# Patient Record
Sex: Male | Born: 1950
Health system: Southern US, Community
[De-identification: ages and names within clinical notes are randomized; demographics above are authoritative.]

## PROBLEM LIST (undated history)

## (undated) DIAGNOSIS — M2550 Pain in unspecified joint: Secondary | ICD-10-CM

## (undated) DIAGNOSIS — R002 Palpitations: Secondary | ICD-10-CM

## (undated) DIAGNOSIS — R112 Nausea with vomiting, unspecified: Secondary | ICD-10-CM

## (undated) DIAGNOSIS — R011 Cardiac murmur, unspecified: Secondary | ICD-10-CM

## (undated) DIAGNOSIS — F419 Anxiety disorder, unspecified: Secondary | ICD-10-CM

## (undated) DIAGNOSIS — N529 Male erectile dysfunction, unspecified: Secondary | ICD-10-CM

## (undated) DIAGNOSIS — R55 Syncope and collapse: Secondary | ICD-10-CM

## (undated) DIAGNOSIS — G4730 Sleep apnea, unspecified: Secondary | ICD-10-CM

## (undated) DIAGNOSIS — R269 Unspecified abnormalities of gait and mobility: Secondary | ICD-10-CM

## (undated) DIAGNOSIS — E1142 Type 2 diabetes mellitus with diabetic polyneuropathy: Secondary | ICD-10-CM

## (undated) DIAGNOSIS — I2510 Atherosclerotic heart disease of native coronary artery without angina pectoris: Secondary | ICD-10-CM

## (undated) DIAGNOSIS — I10 Essential (primary) hypertension: Secondary | ICD-10-CM

## (undated) DIAGNOSIS — E7800 Pure hypercholesterolemia, unspecified: Secondary | ICD-10-CM

## (undated) DIAGNOSIS — F319 Bipolar disorder, unspecified: Secondary | ICD-10-CM

## (undated) DIAGNOSIS — Z87442 Personal history of urinary calculi: Secondary | ICD-10-CM

## (undated) DIAGNOSIS — M1990 Unspecified osteoarthritis, unspecified site: Secondary | ICD-10-CM

## (undated) DIAGNOSIS — R2689 Other abnormalities of gait and mobility: Secondary | ICD-10-CM

## (undated) DIAGNOSIS — E039 Hypothyroidism, unspecified: Secondary | ICD-10-CM

## (undated) DIAGNOSIS — F329 Major depressive disorder, single episode, unspecified: Secondary | ICD-10-CM

## (undated) DIAGNOSIS — Z9889 Other specified postprocedural states: Secondary | ICD-10-CM

## (undated) DIAGNOSIS — R519 Headache, unspecified: Secondary | ICD-10-CM

## (undated) DIAGNOSIS — E119 Type 2 diabetes mellitus without complications: Secondary | ICD-10-CM

## (undated) DIAGNOSIS — F32A Depression, unspecified: Secondary | ICD-10-CM

## (undated) HISTORY — DX: Male erectile dysfunction, unspecified: N52.9

## (undated) HISTORY — DX: Anxiety disorder, unspecified: F41.9

## (undated) HISTORY — DX: Depression, unspecified: F32.A

## (undated) HISTORY — DX: Essential (primary) hypertension: I10

## (undated) HISTORY — PX: APPENDECTOMY: SHX54

## (undated) HISTORY — DX: Palpitations: R00.2

## (undated) HISTORY — DX: Unspecified abnormalities of gait and mobility: R26.9

## (undated) HISTORY — DX: Unspecified osteoarthritis, unspecified site: M19.90

## (undated) HISTORY — PX: VASECTOMY REVERSAL: SHX243

## (undated) HISTORY — DX: Pure hypercholesterolemia, unspecified: E78.00

## (undated) HISTORY — DX: Major depressive disorder, single episode, unspecified: F32.9

## (undated) HISTORY — PX: EYE SURGERY: SHX253

## (undated) HISTORY — DX: Pain in unspecified joint: M25.50

## (undated) HISTORY — DX: Other abnormalities of gait and mobility: R26.89

## (undated) HISTORY — DX: Syncope and collapse: R55

## (undated) HISTORY — DX: Type 2 diabetes mellitus with diabetic polyneuropathy: E11.42

## (undated) HISTORY — DX: Hypothyroidism, unspecified: E03.9

## (undated) HISTORY — DX: Bipolar disorder, unspecified: F31.9

## (undated) HISTORY — DX: Type 2 diabetes mellitus without complications: E11.9

## (undated) HISTORY — PX: CHOLECYSTECTOMY: SHX55

---
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
## 1972-02-13 HISTORY — PX: NASAL SEPTUM SURGERY: SHX37

## 1978-02-12 DIAGNOSIS — E119 Type 2 diabetes mellitus without complications: Secondary | ICD-10-CM

## 1978-02-12 HISTORY — DX: Type 2 diabetes mellitus without complications: E11.9

## 1986-02-12 HISTORY — PX: VASECTOMY: SHX75

## 1993-02-12 DIAGNOSIS — I10 Essential (primary) hypertension: Secondary | ICD-10-CM

## 1993-02-12 HISTORY — DX: Essential (primary) hypertension: I10

## 2000-02-13 HISTORY — PX: COLONOSCOPY W/ POLYPECTOMY: SHX1380

## 2000-04-30 ENCOUNTER — Other Ambulatory Visit: Admission: RE | Admit: 2000-04-30 | Discharge: 2000-04-30 | Payer: Self-pay | Admitting: Gastroenterology

## 2000-04-30 ENCOUNTER — Encounter (INDEPENDENT_AMBULATORY_CARE_PROVIDER_SITE_OTHER): Payer: Self-pay

## 2001-10-08 ENCOUNTER — Ambulatory Visit (HOSPITAL_COMMUNITY): Admission: RE | Admit: 2001-10-08 | Discharge: 2001-10-08 | Payer: Self-pay | Admitting: Family Medicine

## 2002-05-21 ENCOUNTER — Encounter: Payer: Self-pay | Admitting: Family Medicine

## 2002-05-21 ENCOUNTER — Ambulatory Visit (HOSPITAL_COMMUNITY): Admission: RE | Admit: 2002-05-21 | Discharge: 2002-05-21 | Payer: Self-pay | Admitting: Family Medicine

## 2002-05-22 ENCOUNTER — Inpatient Hospital Stay (HOSPITAL_COMMUNITY): Admission: EM | Admit: 2002-05-22 | Discharge: 2002-05-24 | Payer: Self-pay | Admitting: Emergency Medicine

## 2002-05-22 ENCOUNTER — Encounter: Payer: Self-pay | Admitting: Family Medicine

## 2003-10-14 ENCOUNTER — Ambulatory Visit: Payer: Self-pay | Admitting: Psychiatry

## 2003-11-25 ENCOUNTER — Ambulatory Visit: Payer: Self-pay | Admitting: Psychiatry

## 2004-01-10 ENCOUNTER — Ambulatory Visit: Payer: Self-pay | Admitting: Psychiatry

## 2004-01-24 ENCOUNTER — Ambulatory Visit: Payer: Self-pay | Admitting: Psychiatry

## 2004-03-07 ENCOUNTER — Ambulatory Visit: Payer: Self-pay | Admitting: Psychiatry

## 2004-03-07 ENCOUNTER — Other Ambulatory Visit (HOSPITAL_COMMUNITY): Admission: RE | Admit: 2004-03-07 | Discharge: 2004-06-05 | Payer: Self-pay | Admitting: Psychiatry

## 2004-04-21 ENCOUNTER — Ambulatory Visit: Payer: Self-pay | Admitting: Psychiatry

## 2004-06-01 ENCOUNTER — Ambulatory Visit: Payer: Self-pay | Admitting: Psychiatry

## 2004-08-17 ENCOUNTER — Ambulatory Visit: Payer: Self-pay | Admitting: Psychiatry

## 2004-09-28 ENCOUNTER — Ambulatory Visit: Payer: Self-pay | Admitting: Psychiatry

## 2004-11-14 ENCOUNTER — Ambulatory Visit: Payer: Self-pay | Admitting: Psychiatry

## 2004-12-26 ENCOUNTER — Ambulatory Visit: Payer: Self-pay | Admitting: Psychiatry

## 2005-02-13 ENCOUNTER — Ambulatory Visit: Payer: Self-pay | Admitting: Psychiatry

## 2005-04-05 ENCOUNTER — Ambulatory Visit: Payer: Self-pay | Admitting: Psychiatry

## 2005-04-24 ENCOUNTER — Ambulatory Visit (HOSPITAL_COMMUNITY): Payer: Self-pay | Admitting: Psychiatry

## 2005-05-01 ENCOUNTER — Ambulatory Visit (HOSPITAL_COMMUNITY): Payer: Self-pay | Admitting: Psychiatry

## 2005-05-25 ENCOUNTER — Ambulatory Visit (HOSPITAL_COMMUNITY): Payer: Self-pay | Admitting: Psychiatry

## 2005-06-05 ENCOUNTER — Ambulatory Visit (HOSPITAL_COMMUNITY): Payer: Self-pay | Admitting: Psychiatry

## 2005-07-05 ENCOUNTER — Ambulatory Visit (HOSPITAL_COMMUNITY): Payer: Self-pay | Admitting: Psychiatry

## 2005-08-30 ENCOUNTER — Ambulatory Visit (HOSPITAL_COMMUNITY): Payer: Self-pay | Admitting: Psychiatry

## 2005-09-27 ENCOUNTER — Ambulatory Visit (HOSPITAL_COMMUNITY): Payer: Self-pay | Admitting: Psychiatry

## 2005-11-22 ENCOUNTER — Ambulatory Visit (HOSPITAL_COMMUNITY): Payer: Self-pay | Admitting: Psychiatry

## 2005-12-18 ENCOUNTER — Ambulatory Visit (HOSPITAL_COMMUNITY): Payer: Self-pay | Admitting: Psychiatry

## 2006-01-29 ENCOUNTER — Ambulatory Visit (HOSPITAL_COMMUNITY): Payer: Self-pay | Admitting: Psychiatry

## 2006-04-02 ENCOUNTER — Ambulatory Visit (HOSPITAL_COMMUNITY): Payer: Self-pay | Admitting: Psychiatry

## 2006-04-30 ENCOUNTER — Ambulatory Visit (HOSPITAL_COMMUNITY): Payer: Self-pay | Admitting: Psychiatry

## 2006-05-28 ENCOUNTER — Ambulatory Visit (HOSPITAL_COMMUNITY): Payer: Self-pay | Admitting: Psychiatry

## 2006-07-30 ENCOUNTER — Ambulatory Visit (HOSPITAL_COMMUNITY): Payer: Self-pay | Admitting: Psychiatry

## 2006-09-05 ENCOUNTER — Ambulatory Visit (HOSPITAL_COMMUNITY): Payer: Self-pay | Admitting: Psychiatry

## 2006-10-07 ENCOUNTER — Ambulatory Visit (HOSPITAL_COMMUNITY): Payer: Self-pay | Admitting: Psychology

## 2006-10-23 ENCOUNTER — Ambulatory Visit (HOSPITAL_COMMUNITY): Payer: Self-pay | Admitting: Psychology

## 2006-11-05 ENCOUNTER — Ambulatory Visit (HOSPITAL_COMMUNITY): Payer: Self-pay | Admitting: Psychiatry

## 2006-12-05 ENCOUNTER — Ambulatory Visit (HOSPITAL_COMMUNITY): Payer: Self-pay | Admitting: Psychiatry

## 2006-12-18 ENCOUNTER — Ambulatory Visit (HOSPITAL_COMMUNITY): Payer: Self-pay | Admitting: Psychology

## 2006-12-19 ENCOUNTER — Ambulatory Visit (HOSPITAL_COMMUNITY): Payer: Self-pay | Admitting: Psychiatry

## 2007-01-02 ENCOUNTER — Ambulatory Visit (HOSPITAL_COMMUNITY): Payer: Self-pay | Admitting: Psychology

## 2007-02-18 ENCOUNTER — Ambulatory Visit (HOSPITAL_COMMUNITY): Payer: Self-pay | Admitting: Psychiatry

## 2007-05-20 ENCOUNTER — Ambulatory Visit (HOSPITAL_COMMUNITY): Payer: Self-pay | Admitting: Psychiatry

## 2007-06-24 ENCOUNTER — Ambulatory Visit (HOSPITAL_COMMUNITY): Payer: Self-pay | Admitting: Psychiatry

## 2007-07-17 ENCOUNTER — Ambulatory Visit: Payer: Self-pay | Admitting: Cardiovascular Disease

## 2007-07-17 ENCOUNTER — Encounter: Payer: Self-pay | Admitting: Cardiovascular Disease

## 2007-07-17 ENCOUNTER — Ambulatory Visit (HOSPITAL_COMMUNITY): Admission: RE | Admit: 2007-07-17 | Discharge: 2007-07-17 | Payer: Self-pay | Admitting: Cardiovascular Disease

## 2007-08-07 ENCOUNTER — Ambulatory Visit: Payer: Self-pay | Admitting: Cardiovascular Disease

## 2007-08-18 ENCOUNTER — Ambulatory Visit: Payer: Self-pay | Admitting: Cardiovascular Disease

## 2007-11-06 ENCOUNTER — Ambulatory Visit (HOSPITAL_COMMUNITY): Payer: Self-pay | Admitting: Psychiatry

## 2007-11-27 ENCOUNTER — Ambulatory Visit (HOSPITAL_COMMUNITY): Payer: Self-pay | Admitting: Psychiatry

## 2008-04-01 ENCOUNTER — Ambulatory Visit (HOSPITAL_COMMUNITY): Payer: Self-pay | Admitting: Psychiatry

## 2008-05-04 ENCOUNTER — Ambulatory Visit (HOSPITAL_COMMUNITY): Payer: Self-pay | Admitting: Psychiatry

## 2008-06-09 ENCOUNTER — Emergency Department (HOSPITAL_COMMUNITY): Admission: EM | Admit: 2008-06-09 | Discharge: 2008-06-09 | Payer: Self-pay | Admitting: Emergency Medicine

## 2008-07-01 ENCOUNTER — Ambulatory Visit (HOSPITAL_COMMUNITY): Payer: Self-pay | Admitting: Psychiatry

## 2008-08-03 ENCOUNTER — Ambulatory Visit (HOSPITAL_COMMUNITY): Payer: Self-pay | Admitting: Psychiatry

## 2008-09-28 ENCOUNTER — Ambulatory Visit (HOSPITAL_COMMUNITY): Payer: Self-pay | Admitting: Psychiatry

## 2009-01-04 ENCOUNTER — Ambulatory Visit (HOSPITAL_COMMUNITY): Payer: Self-pay | Admitting: Psychiatry

## 2010-01-30 ENCOUNTER — Ambulatory Visit (HOSPITAL_COMMUNITY)
Admission: RE | Admit: 2010-01-30 | Discharge: 2010-01-30 | Payer: Self-pay | Source: Home / Self Care | Attending: Ophthalmology | Admitting: Ophthalmology

## 2010-04-24 LAB — BASIC METABOLIC PANEL
GFR calc non Af Amer: >60 mL/min (ref >60)
Potassium: 4.2 mEq/L (ref 3.5–5.1)
Sodium: 138 mEq/L (ref 135–145)

## 2010-04-24 LAB — HEMOGLOBIN AND HEMATOCRIT, BLOOD
HCT: 41.8 % (ref 39.0–52.0)
Hemoglobin: 14.7 g/dL (ref 13.0–17.0)

## 2010-04-24 LAB — GLUCOSE, CAPILLARY: Glucose-Capillary: 190 mg/dL — ABNORMAL HIGH (ref 70–99)

## 2010-05-04 ENCOUNTER — Encounter: Payer: Self-pay | Admitting: Gastroenterology

## 2010-05-11 NOTE — Letter (Signed)
Summary: Colonoscopy Letter  [facility]  [address] [place], [place] [address]   Phone: [phone]  Fax: [phone]      May 04, 2010 MRN: [id number]   Joshua Perez [address] [place], [place]  [address]   Dear Mr. IANNELLO,   According to your medical record, it is time for you to schedule a Colonoscopy. The American Cancer Society recommends this procedure as a method to detect early colon cancer. Patients with a family history of colon cancer, or a personal history of colon polyps or inflammatory bowel disease are at increased risk.  This letter has been generated based on the recommendations made at the time of your procedure. If you feel that in your particular situation this may no longer apply, please contact our office.  Please call our office at [phone] to schedule this appointment or to update your records at your earliest convenience.  Thank you for cooperating with [place] to provide you with the very best care possible.   Sincerely,  Judie Petit T. Russella Dar, M.D.  [facility] [phone]

## 2010-05-24 LAB — BASIC METABOLIC PANEL
BUN: 13 mg/dL (ref 6–23)
Calcium: 9.7 mg/dL (ref 8.4–10.5)
GFR calc non Af Amer: >60 mL/min (ref >60)
Potassium: 3.7 mEq/L (ref 3.5–5.1)

## 2010-05-24 LAB — POCT CARDIAC MARKERS
CKMB, poc: <1 ng/mL — ABNORMAL LOW (ref 1.0–8.0)
Troponin i, poc: <0.05 ng/mL (ref 0.00–0.09)
Troponin i, poc: <0.05 ng/mL (ref 0.00–0.09)

## 2010-06-18 ENCOUNTER — Inpatient Hospital Stay (HOSPITAL_COMMUNITY)
Admission: EM | Admit: 2010-06-18 | Discharge: 2010-06-20 | DRG: 391 | Disposition: A | Discharge status: Home / Self Care | Payer: Medicare Other | Attending: Internal Medicine | Admitting: Internal Medicine

## 2010-06-18 ENCOUNTER — Emergency Department (HOSPITAL_COMMUNITY): Payer: Medicare Other

## 2010-06-18 DIAGNOSIS — A088 Other specified intestinal infections: Principal | ICD-10-CM | POA: Diagnosis present

## 2010-06-18 DIAGNOSIS — I10 Essential (primary) hypertension: Secondary | ICD-10-CM | POA: Diagnosis present

## 2010-06-18 DIAGNOSIS — E860 Dehydration: Secondary | ICD-10-CM | POA: Diagnosis present

## 2010-06-18 DIAGNOSIS — R578 Other shock: Secondary | ICD-10-CM | POA: Diagnosis present

## 2010-06-18 DIAGNOSIS — E119 Type 2 diabetes mellitus without complications: Secondary | ICD-10-CM | POA: Diagnosis present

## 2010-06-18 DIAGNOSIS — F319 Bipolar disorder, unspecified: Secondary | ICD-10-CM | POA: Diagnosis present

## 2010-06-18 LAB — POCT CARDIAC MARKERS
CKMB, poc: <1 ng/mL — ABNORMAL LOW (ref 1.0–8.0)
Myoglobin, poc: 91.9 ng/mL (ref 12–200)

## 2010-06-18 LAB — BASIC METABOLIC PANEL
BUN: 28 mg/dL — ABNORMAL HIGH (ref 6–23)
BUN: 24 mg/dL — ABNORMAL HIGH (ref 6–23)
CO2: 19 mEq/L (ref 19–32)
Calcium: 8.3 mg/dL — ABNORMAL LOW (ref 8.4–10.5)
Calcium: 7.3 mg/dL — ABNORMAL LOW (ref 8.4–10.5)
Creatinine, Ser: 1.86 mg/dL — ABNORMAL HIGH (ref 0.4–1.5)
Creatinine, Ser: 1.3 mg/dL (ref 0.4–1.5)
GFR calc Af Amer: >60 mL/min (ref >60)
GFR calc non Af Amer: 37 mL/min — ABNORMAL LOW (ref >60)
GFR calc non Af Amer: 56 mL/min — ABNORMAL LOW (ref >60)
Glucose, Bld: 173 mg/dL — ABNORMAL HIGH (ref 70–99)
Sodium: 134 mEq/L — ABNORMAL LOW (ref 135–145)

## 2010-06-18 LAB — URINALYSIS, ROUTINE W REFLEX MICROSCOPIC
Glucose, UA: NEGATIVE mg/dL
Nitrite: NEGATIVE
Specific Gravity, Urine: 1.025 (ref 1.005–1.030)
pH: 5.5 (ref 5.0–8.0)

## 2010-06-18 LAB — DIFFERENTIAL
Basophils Absolute: 0.1 10*3/uL (ref 0.0–0.1)
Basophils Relative: 0 % (ref 0–1)
Eosinophils Absolute: 0.3 10*3/uL (ref 0.0–0.7)
Monocytes Relative: 15 % — ABNORMAL HIGH (ref 3–12)
Neutro Abs: 11.5 10*3/uL — ABNORMAL HIGH (ref 1.7–7.7)
Neutrophils Relative %: 71 % (ref 43–77)

## 2010-06-18 LAB — LITHIUM LEVEL: Lithium Lvl: 1.49 mEq/L — ABNORMAL HIGH (ref 0.80–1.40)

## 2010-06-18 LAB — MAGNESIUM: Magnesium: 1.6 mg/dL (ref 1.5–2.5)

## 2010-06-18 LAB — HEPATIC FUNCTION PANEL
Bilirubin, Direct: 0.2 mg/dL (ref 0.0–0.3)
Indirect Bilirubin: 0.4 mg/dL (ref 0.3–0.9)
Total Bilirubin: 0.6 mg/dL (ref 0.3–1.2)

## 2010-06-18 LAB — CBC
Hemoglobin: 15.5 g/dL (ref 13.0–17.0)
MCH: 31.9 pg (ref 26.0–34.0)
MCHC: 33.3 g/dL (ref 30.0–36.0)
Platelets: 367 10*3/uL (ref 150–400)
RBC: 4.86 MIL/uL (ref 4.22–5.81)

## 2010-06-18 LAB — GLUCOSE, CAPILLARY: Glucose-Capillary: 180 mg/dL — ABNORMAL HIGH (ref 70–99)

## 2010-06-18 LAB — MRSA PCR SCREENING

## 2010-06-18 LAB — PROCALCITONIN: Procalcitonin: 0.25 ng/mL

## 2010-06-18 MED ORDER — IOHEXOL 300 MG/ML  SOLN
80.0000 mL | Freq: Once | INTRAMUSCULAR | Status: AC | PRN
  Administered 2010-06-18: 80 mL via INTRAVENOUS

## 2010-06-19 LAB — LIPASE, BLOOD: Lipase: 83 U/L — ABNORMAL HIGH (ref 11–59)

## 2010-06-19 LAB — GLUCOSE, CAPILLARY
Glucose-Capillary: 135 mg/dL — ABNORMAL HIGH (ref 70–99)
Glucose-Capillary: 184 mg/dL — ABNORMAL HIGH (ref 70–99)

## 2010-06-19 LAB — CBC
Hemoglobin: 12.9 g/dL — ABNORMAL LOW (ref 13.0–17.0)
MCV: 97.3 fL (ref 78.0–100.0)
Platelets: 270 10*3/uL (ref 150–400)
RBC: 4.09 MIL/uL — ABNORMAL LOW (ref 4.22–5.81)
WBC: 14.3 10*3/uL — ABNORMAL HIGH (ref 4.0–10.5)

## 2010-06-19 LAB — COMPREHENSIVE METABOLIC PANEL
Albumin: 3 g/dL — ABNORMAL LOW (ref 3.5–5.2)
Alkaline Phosphatase: 64 U/L (ref 39–117)
BUN: 16 mg/dL (ref 6–23)
CO2: 23 mEq/L (ref 19–32)
Chloride: 108 mEq/L (ref 96–112)
GFR calc non Af Amer: >60 mL/min (ref >60)
Glucose, Bld: 149 mg/dL — ABNORMAL HIGH (ref 70–99)
Potassium: 4 mEq/L (ref 3.5–5.1)
Total Bilirubin: 0.3 mg/dL (ref 0.3–1.2)

## 2010-06-19 LAB — URINE CULTURE
Colony Count: NO GROWTH
Culture: NO GROWTH

## 2010-06-19 LAB — DIFFERENTIAL
Basophils Relative: 1 % (ref 0–1)
Eosinophils Absolute: 0.5 10*3/uL (ref 0.0–0.7)
Lymphs Abs: 2.9 10*3/uL (ref 0.7–4.0)
Neutro Abs: 9 10*3/uL — ABNORMAL HIGH (ref 1.7–7.7)
Neutrophils Relative %: 63 % (ref 43–77)

## 2010-06-19 NOTE — Group Therapy Note (Signed)
  NAME:  Joshua Perez, Joshua Perez              ACCOUNT NO.:  [id number]  MEDICAL RECORD NO.:  [id number]           PATIENT TYPE:  I  LOCATION:  IC01                          FACILITY:  [facility]  PHYSICIAN:  Wilson Singer, M.D.DATE OF BIRTH:  1951-01-08  DATE OF PROCEDURE:  06/19/2010 DATE OF DISCHARGE:                                PROGRESS NOTE   HISTORY OF PRESENT ILLNESS:  This man was admitted yesterday with profuse diarrhea and hypovolemic shock.  He has required several liters of fluid and he has had a total of 5 liters of bolus fluid and ongoing fluid at 150 mL an hour.  Now his blood pressure is better, and he feels better overall.  He says he has had no further diarrhea this morning.  PHYSICAL EXAMINATION:  VITAL SIGNS:  Temperature 97.8, blood pressure 100/55, pulse 64, saturation 97% on room air. GENERAL:  He looks well.  He does not look septic. CARDIOVASCULAR:  Heart sounds are present, normal. LUNG:  Fields are clear. ABDOMEN:  Soft and nontender.  Bowel sounds are rather scanty at the present time.  INVESTIGATIONS:  Hemoglobin 12.9, white blood cell count 14.3, platelets 270.  Sodium 138, potassium 4.0, bicarbonate 23, BUN 16, creatinine 0.94.  Lithium level elevated slightly at 1.49.  IMPRESSION: 1. Gastroenteritis, probably viral in origin. 2. Diabetes. 3. History of hypertension. 4. Bipolar disorder.  PLAN: 1. Reduce intravenous fluids. 2. Move to the regular floor now. 3. Start on a BRAT diet and progress to a diabetic diet if he     tolerates this. 4. Hopefully, we will be able to discharge him probably tomorrow.    Wilson Singer, M.D.    NCG/MEDQ  D:  06/19/2010  T:  06/19/2010  Job:  [id number]  Electronically Signed by Lilly Cove M.D. on 06/19/2010 10:52:45 AM

## 2010-06-19 NOTE — H&P (Signed)
NAME:  Joshua, Perez              ACCOUNT NO.:  [id number]  MEDICAL RECORD NO.:  [id number]           PATIENT TYPE:  I  LOCATION:  IC01                          FACILITY:  [facility]  PHYSICIAN:  Wilson Singer, M.D.DATE OF BIRTH:  06/08/1950  DATE OF ADMISSION:  06/18/2010 DATE OF DISCHARGE:  LH                             HISTORY & PHYSICAL   PRIMARY CARE PHYSICIAN:  Dr. Gerda Diss.  CHIEF COMPLAINT:  Vomiting and diarrhea.  HISTORY OF PRESENT ILLNESS:  This 60 year old man gives approximately 4- day history of vomiting and diarrhea.  The vomiting seems to subsided since he has come to the emergency room,  but the diarrhea continues.  He did not notice any blood in the diarrhea.  He has not been febrile. When he came to the emergency room, he was extremely hypotensive with a blood pressure of 72/42.  Since this time, he has had approximately 4 liters of fluid and his blood pressure seems to have improved to 106/53. He feels significantly better.  He has not been in contact with anyone who has had similar symptoms and he denies eating any food out of the ordinary.  PAST MEDICAL HISTORY: 1. Diabetes. 2. Hypertension. 3. Hypothyroidism. 4. Bipolar disorder.  MEDICATIONS:  He is on lithium, Actos, Effexor, moexipril, simvastatin, Xanax, multivitamins, vitamin C, trazodone, Flomax, levothyroxine, aspirin, vitamin D.  The doses of all these medications are known to him.  ALLERGIES:  PENICILLINS.  SOCIAL HISTORY:  He has been married for 40 years.  He does not smoke, does not drink alcohol.  He is on disability for his depression and bipolar disorder.  FAMILY HISTORY:  Noncontributory.  REVIEW OF SYSTEMS:  Apart from symptoms mentioned above, there are no other symptoms referable to all systems reviewed.  PHYSICAL EXAMINATION:  VITAL SIGNS:  Temperature 98.1, blood pressure currently 93/51, pulse 80, saturation 94% on room air, respiratory rate 12 to 14. GENERAL:  He does  look systemically, reasonably well and not in a shock state.  Does not look toxic.  There is no pallor.  There is no clubbing. There is no jaundice. CARDIOVASCULAR:  Heart sounds are present and normal without murmurs. Jugular venous pressure is not raised. RESPIRATORY:  Lung fields are clear. ABDOMEN:  Soft and nontender without any hepatosplenomegaly.  There are no masses felt.  Bowel sounds are present. NEUROLOGIC:  He is alert and oriented without any focal neurologic signs. SKIN:  There are no skin changes, rashes or lesions.  INVESTIGATIONS:  Hemoglobin 15.5, white blood cell count 16.0, platelets 367.  Sodium 134, potassium 3.1, glucose 173, bicarbonate 28, creatinine 1.86, which I think is probably increase from his baseline and in fact in December 2011, his creatinine was normal at 1.05.  Hepatic function panel is within normal limits.  Lipase is slightly elevated at 82 and I am not convinced clinically as he has pancreatitis.  Lactic acid however is also somewhat increased at 2.5.  PROBLEM LIST: 1. Acute gastroenteritis, probably viral in origin, but could be     bacterial also. 2. Hypovolemia shock. 3. Acute renal failure. 4. Diabetes. 5. History of hypertension. 6. History  of bipolar disorder.  PLAN: 1. Admit to step-down unit and monitor blood pressure closely. 2. Aggressive intravenous fluids. 3. Stool for culture and C diff toxin. 4. Control diabetes.  Further recommendations will depend on the patient's hospital progress.     Wilson Singer, M.D.     NCG/MEDQ  D:  06/18/2010  T:  06/18/2010  Job:  [id number]  cc:   Dr. Gerda Diss  Electronically Signed by Lilly Cove M.D. on 06/19/2010 10:52:36 AM

## 2010-06-20 LAB — DIFFERENTIAL
Basophils Absolute: 0.2 10*3/uL — ABNORMAL HIGH (ref 0.0–0.1)
Basophils Relative: 1 % (ref 0–1)
Eosinophils Relative: 4 % (ref 0–5)
Monocytes Absolute: 1.8 10*3/uL — ABNORMAL HIGH (ref 0.1–1.0)

## 2010-06-20 LAB — CBC
Hemoglobin: 14 g/dL (ref 13.0–17.0)
MCH: 31.3 pg (ref 26.0–34.0)
MCHC: 32.3 g/dL (ref 30.0–36.0)
Platelets: 317 10*3/uL (ref 150–400)

## 2010-06-20 LAB — BASIC METABOLIC PANEL
BUN: 6 mg/dL (ref 6–23)
Calcium: 8.7 mg/dL (ref 8.4–10.5)
Creatinine, Ser: 0.8 mg/dL (ref 0.4–1.5)
GFR calc non Af Amer: >60 mL/min (ref >60)
Glucose, Bld: 193 mg/dL — ABNORMAL HIGH (ref 70–99)

## 2010-06-20 LAB — OVA AND PARASITE EXAMINATION

## 2010-06-22 LAB — MRSA CULTURE

## 2010-06-23 LAB — STOOL CULTURE

## 2010-06-24 LAB — CULTURE, BLOOD (ROUTINE X 2)

## 2010-06-27 NOTE — Discharge Summary (Signed)
NAME:  Joshua Perez, Joshua Perez              ACCOUNT NO.:  [id number]  MEDICAL RECORD NO.:  [id number]           PATIENT TYPE:  I  LOCATION:  A335                          FACILITY:  [facility]  PHYSICIAN:  Wilson Singer, M.D.DATE OF BIRTH:  [birth year]  DATE OF ADMISSION:  06/18/2010 DATE OF DISCHARGE:  05/08/2012LH                              DISCHARGE SUMMARY   PRIMARY CARE PHYSICIAN:  Dr. Gerda Diss  FINAL DISCHARGE DIAGNOSIS: 1. Viral gastroenteritis. 2. Diabetes. 3. Hypertension. 4. Bipolar disorder.  CONDITION ON DISCHARGE:  Stable.  MEDICATIONS ON DISCHARGE:  The patient will continue all home medications which includes; 1. Actos 15 mg daily. 2. Alprazolam 2 mg t.i.d. 3. Aspirin 81 mg daily. 4. Effexor XR 150 mg b.i.d. 5. Finasteride 5 mg daily. 6. Flomax 0.4 mg 2 capsules daily. 7. Ketorolac ophthalmic eye drops 1 drop right eye 4 times a day. 8. Levothyroxine 100 mcg daily. 9. Lithium 300 mg 2-3 capsules b.i.d. 10.Moexipril 15 mg b.i.d. 11.Multivitamin 1/2 tablet twice daily. 12.Prednisolone acetate eye drops in the right eye 4 times a day. 13.Saphris 10 mg 2 tablets at bedtime. 14.Simvastatin 20 mg at bedtime. 15.Trazodone 100 mg 3 tablets at bedtime. 16.Valium 5 mg daily. 17.Vigamox 0.5 ophthalmic eye drops right eye 4 times a day. 18.Vitamin C 1 g daily. 19.Vitamin D3 400 mg b.i.d.  CONDITION ON DISCHARGE:  Stable.  HISTORY:  This pleasant 60 year old man was admitted with vomiting and diarrhea.  Please see initial history and physical examination done by [name].  HOSPITAL PROGRESS:  When the patient was admitted, he was clinically and biochemically dehydrated with creatinine of 1.86.  His baseline being 1.05 range.  He was given aggressive intravenous fluids.  As initially when he presented, he was in hypovolemic shock state with a blood pressure in the 70s.  He was not, however, toxic.  He was admitted in the step-down unit.  He did well with  aggressive intravenous fluid therapy and he started to improve and able to tolerate a diet.  Today, he feels back to his usual self.  He has had some bowel motions, which are not as diarrheal as they were before and overall he feels well and is able to tolerate diet.  PHYSICAL EXAMINATION:  VITAL SIGNS:  Temperature 98, blood pressure 147/66, pulse 74, saturation 97% on room air. HEART:  Sounds are present and normal. CHEST:  Lung fields are clear. ABDOMEN:  Soft and nontender and bowel sounds are present.  LABORATORY DATA:  Investigations today show hemoglobin 14.0, white blood cell count still little elevated at 17.3, platelets 317.  Sodium 139, potassium 3.6, bicarbonate 18, BUN 6, creatinine 0.8, glucose 193.  C diff toxin was negative.  Lithium level 2 days ago where elevate of 1.49.  I have not checked them today as he is clinically stable.  DISPOSITION:  The patient is stable to be discharged home.  I think, he will continue to improve at home.  I think, he needs to go and see his primary care physician to make sure his white blood cell count does normalize in the next week or 2.  He  will restart all his home medications including the lithium.     Wilson Singer, M.D.     NCG/MEDQ  D:  06/20/2010  T:  06/20/2010  Job:  [id number]  cc:   Dr. Gerda Diss  Electronically Signed by Lilly Cove M.D. on 06/27/2010 08:12:07 AM

## 2010-06-27 NOTE — Assessment & Plan Note (Signed)
[facility]                       [place] [facility] OFFICE NOTE   WHITMAN, MEINHARDT                     MRN:          [id number]  DATE:07/17/2007                            DOB:          [birth year]    REFERRING PHYSICIAN:  Donna Bernard, M.D.   Mr. Strebeck is referred by Dr. Lubertha South for further evaluation of  atypical chest pain and palpitations.   IDENTIFICATION:  The patient unfortunately has had lifelong severe  depression.  He has had ECT shock therapy on two occasions one from  [place] and once over at [facility].  He has been on a slew of  antidepressants.   In talking to his wife, up until about 6 months ago he was primarily in  the bed, and he has had extremely limited exercise activity due to his  severe depression and melancholy.  He complains of his heart beating  rapidly.  This does tend to occur with exertion.  The wife seems to  think that he is so deconditioned that his heart beats quickly with any  kind of activity.  I suspect she is correct.   The patient does not get palpitations when in the bed.  He has not had  presyncope.  He has had some atypical chest pain that again seem to be  related to anxiety.  They occur when he thinks too much and possibly at  times when he walks he still feels a bit unsteady on his feet due to his  medication and lack of activity.  I do not have recent lithium level on  him.   PAST MEDICAL HISTORY:  Otherwise fairly remarkable for:  1. Diabetes.  2. Severe depression.  3. Hypertension.   ALLERGIES:  PENICILLIN.   He had a previous right hernia repair, a previous deviated septum  repair, previous vasectomy.   FAMILY HISTORY:  Remarkable for father dying of CHF.  Mother is still  living.   He is on:  1. Oxytrol.  2. Vitamin D.  3. Aspirin.  4. Lithium 300 five times a day.  5. Benztropine 2 mg one-half tablet at bedtime.  6. Hydrochlorothiazide 12.5 a day.  7.  Diazepam 5 three times a day.  8. Risperidone 2 mg a day  9. Effexor 150 a day.  10.Caduet 10 a day.  11.Actos 45 a day.  12.Lexapro 15 a day.   The patient is married, his wife seems extremely supportive given what  she has been through.  She still wants her husband to get back to his  normal state.  She is very proactive with him.  They have 3 children and  6 grandchildren.  As indicated, the patient has been barely functional  and is just starting to get out of the bed and ambulate.   His exam is remarkable for an anxious and slow speaking white male who  seems very tenuous.  Weight is 288.  Blood pressure is 103/67.  Pulse 96  and regular.  Respiratory rate 16.  Afebrile.  He is tremulous in his  hands.  HEENT:  Unremarkable.  Carotids normal without  bruit.  No  lymphadenopathy, thyromegaly JVP elevation.  LUNGS:  Clear, diaphragmatic motion.  No wheezing, S1-S2 normal heart  sounds.  PMI normal.  ABDOMEN:  Bowel sounds positive.  No AAA, no tenderness.  No bruit.  No  hepatosplenomegaly or reflexes.  Pulses are intact.  No edema.  NEURO:  Nonfocal.  SKIN:  Warm and dry.  No muscular weakness.  The patient is able to  ambulate up and down the hall with a normal increase in heart rate to  about 110.  He lifts a little bit from side-to-side with his ambulation.   IMPRESSION:  1. Palpitations.  Likely benign and due to deconditioning.  Also      likely due to multiple centrally acting drugs that he takes for his      anxiety and depression.  We will give and event monitor for 4 weeks      and let him activate it for what he feels is palpitations.      Hopefully it will remain sinus tachycardia.  Would not add With not      had beta blockers at this time due to his history of melancholy and      depression.  2. Dyspnea likely functional again from deconditioning.  Check 2-D      echocardiogram, assess RV and LV function.  3. Hypertension currently well-controlled.  Continue  current dose of      Caduet and diuretic.  4. Diabetes.  I am not sure the patient's severity of diabetes.      However, I think he would probably be better served with Glucophage      rather than Actos in terms of his fluid retention, hemoglobin A1c      quarterly.  5. Anxiety and depression.  Follow up with psychiatrist and primary      care M.D.  He appears to have a long way ago.  He has had 2      previous shock treatments and apparently has been on these current      drugs for a while and is a little unclear to me as to whether he      will ever get over this.   However, it is encouraging the see that his wife is so supportive.  As  long as his event monitor does not show anything serious and his echo is  normal, we will see him back in 3 months.     Noralyn Pick. Eden Emms, MD, [facility]  Electronically Signed   PCN/MedQ  DD: 07/17/2007  DT: 07/17/2007  Job #: [id number]

## 2010-06-27 NOTE — Assessment & Plan Note (Signed)
[facility]                       [place] [facility] OFFICE NOTE   MYRON, STANKOVICH                     MRN:          [id number]  DATE:08/18/2007                            DOB:          [birth year]    Mr. Moffa returns today for followup.  He has had atypical chest pain,  palpitations, and hypertension.   His CardioNet monitor showed no significant arrhythmias.  He primarily  was in sinus rhythm with first-degree AV block.   His echocardiogram showed normal RV and LV function with no evidence of  valvular disease.  He has had less chest pain.  He continues to get  occasional palpitations, particularly after walking.  He seems to be out  and about little bit more.  He has severe depression and has been bed-  bound for quite sometime.  His wife continues to try to motivate him.  I  have reassured them about his monitor and echocardiogram.  He should  have no restrictions to exercise based on heart problem.   REVIEW OF SYSTEMS:  Otherwise remarkable for continued tremulousness  particularly in the right upper extremity, otherwise, negative.   MEDICATIONS:  1. Aspirin.  2. Lithium 300 mg 5 times a day.  3. Hydrochlorothiazide 12.5 a day.  4. Diazepam 5 t.i.d.  5. Effexor 150 b.i.d.  6. Caduet 10 a day.  7. Actos 45 a day.  8. Moexipril 15 mg a day.   ALLERGIES:  He is allergic to PENICILLIN.   PHYSICAL EXAMINATION:  GENERAL:  Remarkable for somewhat tremulous  overweight white male in no distress.  Affect is flat.  VITAL SIGNS:  Weight is 288, blood pressure is 103/67, pulse is 88 and  regular, respiratory rate 14, and afebrile.  HEENT:  Unremarkable.  NECK:  Carotids are normal without bruit.  No lymphadenopathy,  thyromegaly, or JVP elevation.  LUNGS:  Clear.  Good diaphragmatic motion.  No wheezing.  HEART:  S1 and S2 with normal heart sounds.  PMI normal.  ABDOMEN:  Benign.  Bowel sounds positive.  No AAA.  No bruit.  No  tenderness.  No hepatosplenomegaly.  No hepatojugular reflux.  EXTREMITIES:  Distal pulses are intact.  No edema.  NEURO:  Nonfocal.  SKIN:  Warm and dry.  No muscular weakness.   He does have a bit of a tremor in both upper extremities.  There is no  cogwheeling.   His echo was reviewed, EF 60%.  No significant valvular heart disease.   CardioNet monitor reviewed, sinus rhythm.  No significant arrhythmias or  first-degree heart block.   IMPRESSION:  1. Palpitations benign, likely occasional premature atrial      contractions, particularly postexercise.  No indication for beta-      blocker.  2. Hypertension.  Blood pressure currently under good control.      Continue Caduet.  3. Severe depression.  Follow up with his psychiatrist.  Continue      tricyclic and lithium.  We will need PT and OT to increase his      activity levels.  4. Lower extremity edema, improved.  Continue low-dose  hydrochlorothiazide.  I would not advocate higher dose diuretics      given his lithium therapy.  I would also be careful in regards to      his Actos as this may retain fluid.  5. Diabetes.  Follow up Dr. Gerda Diss.  Consider alternative oral agent      that will not retain fluid and require diuretic.   The patient's cardiac status would appear stable without any decrease in  LV function.  He will be seen on an as needed basis.     Noralyn Pick. Eden Emms, MD, [facility]  Electronically Signed    PCN/MedQ  DD: 08/18/2007  DT: 08/19/2007  Job #: [id number]   cc:   Donna Bernard, M.D.

## 2010-06-30 NOTE — Discharge Summary (Signed)
   NAME:  Joshua Perez, Joshua Perez NO.:  [id number]   MEDICAL RECORD NO.:  [id number]                  PATIENT TYPE:   LOCATION:                                       FACILITY:   PHYSICIAN:  Donna Bernard, M.D.             DATE OF BIRTH:   DATE OF ADMISSION:  DATE OF DISCHARGE:  05/24/2002                                 DISCHARGE SUMMARY   FINAL DIAGNOSES:  1. Acute pneumonia.  2. Vasculitis rash secondary to Augmentin.  3. Exacerbation of asthma.  4. Type 2 diabetes.  5. Hypertension.   FINAL DISPOSITION:  1. Patient discharged to home.  2. Follow up in the office in 1 week.   DISCHARGE MEDICATIONS:  1. Increase Actos to 45 mg daily while on prednisone.  2. Prednisone taper as directed.  3. Periactin as needed for itching, 4 mg,  3 times a day.  4. Benadryl 50 mg every 4 hours p.r.n.  5. Finish Levaquin.  6. Albuterol sprays every 4 hours for cough.  7. Xanax as needed.  8. Maintain other meds.   INITIAL HISTORY AND PHYSICAL:  Please see H&P as dictated.   HOSPITAL COURSE:  This patient is a 60 year old white male with a history of  hypertension, type 2 diabetes and asthma.  He presented to the emergency  room on day of admission with complaints of severe shortness of breath and  distress.  He had a severe rash, vasculitis in nature, felt to be due to a  prior dose of IV Augmentin.  He also had very significant wheezes and was  treated in the emergency room by nebulizer therapy and IV steroids.  In  addition, the patient's chest x-ray revealed a probable pneumonia.  Due to  his multiple difficulties also with significant dyspnea along with a blood  gas which revealed a low pCO2 at 22 and a low pO2 at 68 the patient  definitely warranted hospitalization.   We brought him into the hospital, we gave him IV Levaquin.  He given IV Solu-  Medrol, frequent nebulizers treatments, and over the next 48 hours the  patient gradually improved.  His  glucoses were maintained with sliding  scale.  On the day of discharge the patient was stable and discharged home  with the diagnosis and disposition as noted above.                                               Donna Bernard, M.D.    Karie Chimera  D:  07/10/2002  T:  07/10/2002  Job:  [id number]

## 2010-06-30 NOTE — H&P (Signed)
NAME:  Joshua Perez, DIEMER                        ACCOUNT NO.:  [id number]   MEDICAL RECORD NO.:  [id number]                   PATIENT TYPE:  INP   LOCATION:  A211                                 FACILITY:  [facility]   PHYSICIAN:  Donna Bernard, M.D.             DATE OF BIRTH:  01-20-1951   DATE OF ADMISSION:  05/22/2002  DATE OF DISCHARGE:                                HISTORY & PHYSICAL   CHIEF COMPLAINT:  Dyspnea and severely pruritic rash and cough.   HISTORY OF PRESENT ILLNESS:  This patient is a 60 year old white male with a  history of hypertension, type 2 diabetes, and asthma.  He presented to the  emergency room on the day of admission in severe stress.  The patient had  been seen twice in the office over the prior week with the last diagnosis  showing probable pneumonia.  Indeed, the patient's chest x-ray revealed an  infiltrate.  The patient initially had been given Augmentin about a week  prior to hospitalization.  On the day prior to hospitalization he was  switched over to Levaquin orally along with Ventolin p.r.n. for wheezes.  The patient was given a prednisone taper and instructed to take Benadryl as  needed for itching.  On the day of admission the patient called [place] and  stated that he was in severe distress, and he was sent directly to the  emergency room.  The patient noted significant tightness and wheeziness in  the chest.  He noted a worsening rash with tremendous pruritus despite the  steroids and Benadryl.  The patient also had a cough that was productive at  times.  He noted some low-grade fever.  The patient claims compliance with  current medications.   CURRENT MEDICATIONS:  1. Univasc 15 mg daily.  2. Actos 30 mg daily.  3. Hydrochlorothiazide 25 mg daily.  4. Celexa 20 mg daily.  5. Levaquin 500 mg daily.  6. Prednisone taper as directed.   ALLERGIES:  PENICILLIN, AUGMENTIN.   PRIOR SURGERIES:  1. Remote hernia repair.  2. Tonsillectomy.  3.  Vasectomy.  4. Polypectomy.   FAMILY HISTORY:  Positive for diabetes, coronary artery disease.   SOCIAL HISTORY:  The patient is married, with three children.  No tobacco or  alcohol use.  Works at [employer].   REVIEW OF SYSTEMS:  Otherwise negative.   PHYSICAL EXAMINATION:  VITAL SIGNS:  Blood pressure 140/80, temperature 99,  respiratory rate 28 breaths per minute.  GENERAL:  The patient is in clear distress, severely pruritic and breathing  quickly.  HEENT:  Normal.  LUNGS:  Bilateral wheezes and inspiratory crackles.  HEART:  Slight tachycardia.  No significant murmurs.  ABDOMEN:  Soft.  EXTREMITIES:  Normal.  NEUROLOGIC:  Intact.  SKIN:  Very impressive diffuse vasculitic rash with nonblanchable component  over the entire trunk, with a tremendous amount of petechiae and hive-like  eruption.   SIGNIFICANT  LABS:  CBC 11.8 __________white blood count with left shift.  MET-7 okay.  Blood gas revealed pCO2 low at 22, pO2 low at 68.   Chest x-ray:  Infiltrates noted.   IMPRESSION:  1. Pneumonia.  2. Severe allergic reaction to AUGMENTIN, now including a vasculitis-like     rash, but note his oral exam showed no evidence of enanthem.  3. Exacerbation of asthma.  4. Type 2 diabetes.  5. Hypertension.   PLAN:  Admit with IV steroids, frequent nebulizer treatments, Levaquin 500  mg IV q.24h., close observation, oxygen support.  Further orders as noted on  the chart.                                               Donna Bernard, M.D.    Karie Chimera  D:  06/18/2002  T:  06/18/2002  Job:  [id number]

## 2010-06-30 NOTE — Procedures (Signed)
   NAME:  Joshua Perez, GLANCE                        ACCOUNT NO.:  [id number]   MEDICAL RECORD NO.:  [id number]                   PATIENT TYPE:  OUT   LOCATION:  [facility]                                 FACILITY:  [facility]   PHYSICIAN:  Donna Bernard, M.D.             DATE OF BIRTH:  11/10/1950   DATE OF PROCEDURE:  10/08/2001  DATE OF DISCHARGE:  10/08/2001                                    STRESS TEST   INDICATIONS FOR PROCEDURE:  Atypical chest pain in a patient with multiple  risk factors.   DESCRIPTION OF PROCEDURE:  Stress test was performed at standard [facility]  protocol.  Resting EKG revealed normal sinus rhythm with no significant ST-T  changes.  He tolerated the first stage well with no significant changes.  The patient tolerated the second stage also well.  His heart rate towards  the end of the second stage reached a maximum heart rate of 155 with a sub  max predicted at 142.  At this point there were no significant ST segment  changes at 0.08 seconds past the J point.   IMPRESSION:  Negative adequate stress test.   PLAN:  The patient encouraged to get started on a regular exercise program.                                               Donna Bernard, M.D.    WSL/MEDQ  D:  11/12/2001  T:  11/14/2001  Job:  [id number]

## 2010-11-20 ENCOUNTER — Encounter: Payer: Self-pay | Admitting: Cardiology

## 2010-11-21 ENCOUNTER — Encounter: Payer: Self-pay | Admitting: Cardiology

## 2010-11-21 ENCOUNTER — Ambulatory Visit (INDEPENDENT_AMBULATORY_CARE_PROVIDER_SITE_OTHER): Payer: Medicare Other | Admitting: Cardiology

## 2010-11-21 VITALS — BP 153/88 | HR 99 | Resp 18 | Ht 73.0 in | Wt 277.0 lb

## 2010-11-21 DIAGNOSIS — J45909 Unspecified asthma, uncomplicated: Secondary | ICD-10-CM

## 2010-11-21 DIAGNOSIS — I10 Essential (primary) hypertension: Secondary | ICD-10-CM

## 2010-11-21 DIAGNOSIS — F329 Major depressive disorder, single episode, unspecified: Secondary | ICD-10-CM

## 2010-11-21 DIAGNOSIS — F32A Depression, unspecified: Secondary | ICD-10-CM | POA: Insufficient documentation

## 2010-11-21 DIAGNOSIS — R002 Palpitations: Secondary | ICD-10-CM | POA: Insufficient documentation

## 2010-11-21 DIAGNOSIS — E119 Type 2 diabetes mellitus without complications: Secondary | ICD-10-CM | POA: Insufficient documentation

## 2010-11-21 DIAGNOSIS — N529 Male erectile dysfunction, unspecified: Secondary | ICD-10-CM | POA: Insufficient documentation

## 2010-11-21 NOTE — Assessment & Plan Note (Signed)
Blood pressure control is slightly suboptimal today.  Addition of beta blocker at low dose will be considered, both for palpitations and for hypertension, although this may exacerbate asthma.

## 2010-11-21 NOTE — Progress Notes (Signed)
HPI: Joshua Perez is seen in the office today at the kind request of Dr. Lubertha South for evaluation of palpitations.  Patient reports episodes of forceful heartbeat, frequently with a rapid rate.  There is associated chest discomfort but no dyspnea nor diaphoresis.  Symptoms last for a matter of seconds.  This nice gentleman was previously evaluated for similar complaints by Dr. Eden Emms in 2009.  An echocardiogram at that time showed mild LVH with normal LV systolic function, mild aortic valve sclerosis and no significant valvular abnormalities.  Monitoring for one month showed first-degree AV block but no significant arrhythmias.  His palpitations were thought related to premature atrial complexes.  A stress nuclear study was performed in 1996 at the request of Dr. Daleen Squibb and was negative.  A graded exercise test performed in 2003 was also normal.   Current Outpatient Prescriptions on File Prior to Visit  Medication Sig Dispense Refill  . ALPRAZolam (XANAX PO) Take by mouth 4 (four) times daily as needed.        . Ascorbic Acid (VITAMIN C) 100 MG tablet Take 100 mg by mouth daily.        Marland Kitchen aspirin 81 MG tablet Take 81 mg by mouth daily.        . Cholecalciferol (VITAMIN D-3 PO) Take by mouth daily.        . finasteride (PROSCAR) 5 MG tablet Take 5 mg by mouth daily.        Marland Kitchen levothyroxine (SYNTHROID, LEVOTHROID) 75 MCG tablet Take 100 mcg by mouth daily.       Marland Kitchen lithium carbonate 300 MG capsule Take 300 mg by mouth as directed.       . moexipril (UNIVASC) 15 MG tablet Take 30 mg by mouth at bedtime.        . multivitamin (THERAGRAN) per tablet Take 1 tablet by mouth daily.        . simvastatin (ZOCOR) 20 MG tablet Take 20 mg by mouth at bedtime.        . Tamsulosin HCl (FLOMAX) 0.4 MG CAPS Take 0.8 mg by mouth daily after supper.        . traZODone (DESYREL) 100 MG tablet Take 100 mg by mouth as directed.        . venlafaxine (EFFEXOR XR) 150 MG 24 hr capsule Take 150 mg by mouth 2 (two) times daily.           Allergies  Allergen Reactions  . Penicillins       Past Medical History  Diagnosis Date  . Diabetes mellitus     Recent hemoglobin A1c reportedly 5.3; controlled with single oral agent  . Hypertension   . Erectile dysfunction   . Asthma   . Depression 1965    Onset in childhood  . Palpitations     Echocardiogram normal in 07/2007 except for mild LVH     Past Surgical History  Procedure Date  . Hernia repair   . Appendectomy   . Tonsillectomy   . Vasectomy   . Polyp      Family History  Problem Relation Age of Onset  . Diabetes Father   . Heart attack Father      History   Social History  . Marital Status: Married    Spouse Name: N/A    Number of Children: N/A  . Years of Education: N/A   Occupational History  . Not on file.   Social History Main Topics  . Smoking status: Never  Smoker   . Smokeless tobacco: Not on file  . Alcohol Use: Not on file  . Drug Use: Not on file  . Sexually Active: Not on file   Other Topics Concern  . Not on file   Social History Narrative  . No narrative on file     ROS: occasional dizziness without syncope; requires corrective lenses; poor exercise tolerance with dyspnea on mild exertion; all other systems reviewed and are negative.  PHYSICAL EXAM: BP 153/88  Pulse 99  Resp 18  Ht 6\' 1"  (1.854 m)  Wt 125.646 kg (277 lb)  BMI 36.55 kg/m2  General-Well-developed; no acute distress; flat affect Body Habitus-mildly obese HEENT-[place]/AT; PERRL; EOM intact; conjunctiva and lids nl Neck-No JVD; no carotid bruits Endocrine-No thyromegaly Lungs-Clear lung fields; resonant percussion; normal I-to-E ratio Cardiovascular- normal PMI; normal S1 and S2 Abdomen-BS normal; soft and non-tender without masses or organomegaly Musculoskeletal-No deformities, cyanosis or clubbing Neurologic-Nl cranial nerves; symmetric strength and tone Skin- Warm, no significant lesions Extremities-Nl distal pulses; no edema  EKG:   Tracing obtained 11/07/10 reviewed.  Normal sinus rhythm is present although the computer interpretation was atrial flutter.  Technical difficulties were noted.  Anterolateral T wave inversion was present.  ASSESSMENT AND PLAN:

## 2010-11-21 NOTE — Patient Instructions (Signed)
Your physician has recommended that you wear a holter monitor. Holter monitors are medical devices that record the heart's electrical activity. Doctors most often use these monitors to diagnose arrhythmias. Arrhythmias are problems with the speed or rhythm of the heartbeat. The monitor is a small, portable device. You can wear one while you do your normal daily activities. This is usually used to diagnose what is causing palpitations/syncope (passing out).  Your physician recommends that you schedule a follow-up appointment in: 2 weeks

## 2010-11-21 NOTE — Assessment & Plan Note (Addendum)
Symptoms are similar to 2009 when no cardiac abnormalities were identified.  We will proceed with a 48-hour Holter recording since symptoms are occurring on a daily basis.  Patient reports no new medications that could be causing arrhythmias.  He denies excessive caffeine intake.  His lifestyle is extremely sedentary.

## 2010-11-21 NOTE — Assessment & Plan Note (Signed)
Patient reports that his psychiatrist is following lithium levels in order to appropriately adjust the dosage

## 2010-11-22 ENCOUNTER — Ambulatory Visit (HOSPITAL_COMMUNITY)
Admission: RE | Admit: 2010-11-22 | Discharge: 2010-11-22 | Disposition: A | Discharge status: Home / Self Care | Payer: Medicare Other | Source: Ambulatory Visit | Attending: Cardiology | Admitting: Cardiology

## 2010-11-22 ENCOUNTER — Encounter: Payer: Self-pay | Admitting: Cardiology

## 2010-11-22 DIAGNOSIS — R002 Palpitations: Secondary | ICD-10-CM | POA: Insufficient documentation

## 2010-11-22 NOTE — Progress Notes (Signed)
*  PRELIMINARY RESULTS* Echocardiogram 48H Holter monitor has been performed.  Joshua Perez 11/22/2010, 3:10 PM

## 2010-11-28 NOTE — Procedures (Signed)
NAME:  Joshua Perez, Joshua Perez NO.:  [id number]  MEDICAL RECORD NO.:  [id number]  LOCATION:  CARDIOPU                      FACILITY:  [facility]  PHYSICIAN:  Gerrit Friends. Dietrich Pates, MD, FACCDATE OF BIRTH:  November 29, 1950  DATE OF PROCEDURE: DATE OF DISCHARGE:  11/23/2010                               HOLTER MONITOR   CLINICAL DATA:  A 60 year old gentleman with palpitations.  1. Continuous electrocardiographic recording was maintained for 47     hours and 47 minutes during, which the predominant rhythm was     normal sinus with first-degree AV block.  Minimal sinus tachycardia     to a maximal rate of 120 BPM was recorded.  There was considerable     sinus bradycardia of long duration, but modest intensity with heart     rates typically between 54 and 60.  This resulted in a fairly low     average heart rate of 65 BPM. 2. No ventricular ectopy was identified. 3. Rare PACs occurred with an average rate of 5 per hour.  A single 8-     beat run of supraventricular tachycardia at a rate of 125 was     recorded. 4. No significant ST-T-segment elevation or depression seen. 5. A complete diary of activity was returned, but no symptoms were     reported.     Gerrit Friends. Dietrich Pates, MD, [facility]     RMR/MEDQ  D:  11/27/2010  T:  11/28/2010  Job:  [id number]

## 2010-12-04 ENCOUNTER — Encounter: Payer: Self-pay | Admitting: Cardiology

## 2010-12-04 ENCOUNTER — Ambulatory Visit (INDEPENDENT_AMBULATORY_CARE_PROVIDER_SITE_OTHER): Payer: Medicare Other | Admitting: Cardiology

## 2010-12-04 DIAGNOSIS — F32A Depression, unspecified: Secondary | ICD-10-CM

## 2010-12-04 DIAGNOSIS — F329 Major depressive disorder, single episode, unspecified: Secondary | ICD-10-CM

## 2010-12-04 DIAGNOSIS — J45909 Unspecified asthma, uncomplicated: Secondary | ICD-10-CM

## 2010-12-04 DIAGNOSIS — I10 Essential (primary) hypertension: Secondary | ICD-10-CM

## 2010-12-04 DIAGNOSIS — R002 Palpitations: Secondary | ICD-10-CM

## 2010-12-04 MED ORDER — METOPROLOL TARTRATE 25 MG PO TABS: 25.0000 mg | ORAL_TABLET | Freq: Two times a day (BID) | ORAL | Status: DC

## 2010-12-04 NOTE — Assessment & Plan Note (Signed)
Although asthma is noted among the patient's past medical issues, he denies a history of any lung disorders.  He has not experienced wheezing nor dyspnea at rest.  A trial of beta blocker is reasonable, but we will proceed cautiously.

## 2010-12-04 NOTE — Assessment & Plan Note (Signed)
Blood pressure control is marginal with current medication.  Addition of a beta blocker will likely result in adequate control.

## 2010-12-04 NOTE — Progress Notes (Signed)
HPI : Mr. Zeitlin returns to the office as scheduled for continued assessment and treatment of palpitations.  Since his last visit, he has done fairly well.  He carried a Holter monitor and provided [place] with a detailed diary of activity, but reported no symptoms.  No arrhythmias were noted.  Now he tells me that he had palpitations on at least one occasion, but did not indicate that on his activity log.  Current Outpatient Prescriptions on File Prior to Visit  Medication Sig Dispense Refill  . ALPRAZolam (XANAX PO) Take by mouth 4 (four) times daily as needed.        . Ascorbic Acid (VITAMIN C) 100 MG tablet Take 100 mg by mouth daily.        . Asenapine Maleate 10 MG SUBL Place under the tongue.        Marland Kitchen aspirin 81 MG tablet Take 81 mg by mouth daily.        . Cholecalciferol (VITAMIN D-3 PO) Take by mouth daily.        . diazepam (VALIUM) 5 MG tablet Take 5 mg by mouth every 6 (six) hours as needed.        . finasteride (PROSCAR) 5 MG tablet Take 5 mg by mouth daily.        Marland Kitchen levothyroxine (SYNTHROID, LEVOTHROID) 75 MCG tablet Take 100 mcg by mouth daily.       Marland Kitchen lithium carbonate 300 MG capsule Take 300 mg by mouth as directed.       . metFORMIN (GLUCOPHAGE) 500 MG tablet Take 500 mg by mouth 2 (two) times daily with a meal.        . moexipril (UNIVASC) 15 MG tablet Take 30 mg by mouth at bedtime.        . multivitamin (THERAGRAN) per tablet Take 1 tablet by mouth daily.        . simvastatin (ZOCOR) 20 MG tablet Take 20 mg by mouth at bedtime.        . Tamsulosin HCl (FLOMAX) 0.4 MG CAPS Take 0.8 mg by mouth daily after supper.        . traZODone (DESYREL) 100 MG tablet Take 100 mg by mouth as directed.        . venlafaxine (EFFEXOR XR) 150 MG 24 hr capsule Take 150 mg by mouth 2 (two) times daily.           Allergies  Allergen Reactions  . Penicillins Rash    Augmentin      Past medical history, social history, and family history reviewed and updated.  ROS: Denies orthopnea, PND,  ankle edema, lightheadedness or syncope.  PHYSICAL EXAM: BP 140/76  Pulse 74  Resp 18  Ht 6\' 1"  (1.854 m)  Wt 127.914 kg (282 lb)  BMI 37.21 kg/m2  General-Well developed; no acute distress Body habitus-obese Neck-No JVD; no carotid bruits Lungs-few left basilar rales; decreased breath sounds bilaterally; resonant to percussion Cardiovascular-normal PMI; normal S1 and S2; S4 present Abdomen-normal bowel sounds; soft and non-tender without masses or organomegaly Musculoskeletal-No deformities, no cyanosis or clubbing Neurologic-Normal cranial nerves; symmetric strength and tone Skin-Warm, no significant lesions Extremities-distal pulses intact; trace edema  ASSESSMENT AND PLAN:

## 2010-12-04 NOTE — Assessment & Plan Note (Addendum)
Beta blocker could also negatively affect his mood, but this occurs infrequently and is typically relatively minor.  Again, a cautious trial of low-dose metoprolol appears reasonable.

## 2010-12-04 NOTE — Assessment & Plan Note (Signed)
No arrhythmias found to account for palpitations, but he did have some sinus tachycardia.  A trial of low-dose metoprolol will be instituted.

## 2010-12-04 NOTE — Patient Instructions (Signed)
Your physician recommends that you schedule a follow-up appointment in: 2 months with Dr Dietrich Pates  Your physician has recommended you make the following change in your medication:  1)  Start Metoprolol 25mg  Twice a day

## 2011-02-09 ENCOUNTER — Ambulatory Visit: Payer: Medicare Other | Admitting: Cardiology

## 2011-03-05 ENCOUNTER — Ambulatory Visit: Payer: Medicare Other | Admitting: Cardiology

## 2011-03-15 ENCOUNTER — Ambulatory Visit (INDEPENDENT_AMBULATORY_CARE_PROVIDER_SITE_OTHER): Payer: Medicare Other | Admitting: Cardiology

## 2011-03-15 ENCOUNTER — Encounter: Payer: Self-pay | Admitting: Cardiology

## 2011-03-15 VITALS — BP 128/73 | HR 72 | Ht 72.0 in | Wt 274.0 lb

## 2011-03-15 DIAGNOSIS — I10 Essential (primary) hypertension: Secondary | ICD-10-CM

## 2011-03-15 DIAGNOSIS — R002 Palpitations: Secondary | ICD-10-CM

## 2011-03-15 MED ORDER — METOPROLOL TARTRATE 50 MG PO TABS: 50.0000 mg | ORAL_TABLET | Freq: Two times a day (BID) | ORAL | Status: DC

## 2011-03-15 NOTE — Progress Notes (Signed)
Patient ID: Joshua Perez, male   DOB: 1950/09/01, 61 y.o.   MRN: [id number] HPI: Scheduled return visit for this nice gentleman with palpitations.  Since his last appointment, he has been generally well.  He was seen in the ED for worsening symptoms that he believes were related to chronic poisoning with an unknown substance.  Palpitations have improved, and blood pressure has been excellent when assessed at home with systolics typically below 120 and diastolics below 80.  Prior to Admission medications   Medication Sig Start Date End Date Taking? Authorizing Provider  ALPRAZolam (XANAX PO) Take 1 tablet by mouth 2 (two) times daily.    Yes Historical Provider, MD  Ascorbic Acid (VITAMIN C) 100 MG tablet Take 100 mg by mouth daily.     Yes Historical Provider, MD  Asenapine Maleate 10 MG SUBL Place under the tongue.     Yes Historical Provider, MD  aspirin 81 MG tablet Take 81 mg by mouth daily.     Yes Historical Provider, MD  Cholecalciferol (VITAMIN D-3 PO) Take by mouth daily.     Yes Historical Provider, MD  diazepam (VALIUM) 5 MG tablet Take 5 mg by mouth every 6 (six) hours as needed.     Yes Historical Provider, MD  finasteride (PROSCAR) 5 MG tablet Take 5 mg by mouth daily.     Yes Historical Provider, MD  glyBURIDE (DIABETA) 5 MG tablet  01/08/11  Yes Historical Provider, MD  levothyroxine (SYNTHROID, LEVOTHROID) 75 MCG tablet Take 100 mcg by mouth daily.    Yes Historical Provider, MD  lithium carbonate 300 MG capsule Take 300 mg by mouth as directed.    Yes Historical Provider, MD  metFORMIN (GLUCOPHAGE) 500 MG tablet Take 500 mg by mouth 2 (two) times daily with a meal.     Yes Historical Provider, MD  metoprolol tartrate (LOPRESSOR) 25 MG tablet Take 1 tablet (25 mg total) by mouth 2 (two) times daily. 12/04/10 12/04/11 Yes Gerrit Friends. Paul Torpey, MD  moexipril (UNIVASC) 15 MG tablet Take 30 mg by mouth at bedtime.     Yes Historical Provider, MD  multivitamin [facility]) per tablet  Take 1 tablet by mouth daily.     Yes Historical Provider, MD  simvastatin (ZOCOR) 20 MG tablet Take 20 mg by mouth at bedtime.     Yes Historical Provider, MD  Tamsulosin HCl (FLOMAX) 0.4 MG CAPS Take 0.8 mg by mouth daily after supper.     Yes Historical Provider, MD  traMADol Janean Sark) 50 MG tablet  12/27/10  Yes Historical Provider, MD  traZODone (DESYREL) 100 MG tablet Take 100 mg by mouth at bedtime. 2 tabs at bedtime   Yes Historical Provider, MD  venlafaxine (EFFEXOR XR) 150 MG 24 hr capsule Take 150 mg by mouth 2 (two) times daily.     Yes Historical Provider, MD   Allergies  Allergen Reactions  . Penicillins Rash    Augmentin  Past medical history, social history, and family history reviewed and updated.  ROS: No orthopnea, PND, wheezing, dyspnea or chest discomfort.  PHYSICAL EXAM: BP 128/73  Pulse 72  Ht 6' (1.829 m)  Wt 124.286 kg (274 lb)  BMI 37.16 kg/m2  General-Well developed; no acute distress Body habitus-moderately overweight Neck-No JVD; no carotid bruits Lungs-clear lung fields; resonant to percussion Cardiovascular-normal PMI; normal S1 and S2 Abdomen-normal bowel sounds; soft and non-tender without masses or organomegaly Musculoskeletal-No deformities, no cyanosis or clubbing Neurologic-Normal cranial nerves; symmetric strength and tone Skin-Warm, no  significant lesions Extremities-distal pulses intact; trace edema  Rhythm Strip: normal sinus rhythm; within normal limits.  ASSESSMENT AND PLAN:  [name], MD 03/15/2011 3:02 PM

## 2011-03-15 NOTE — Patient Instructions (Signed)
Your physician recommends that you schedule a follow-up appointment in: As needed  Your physician has recommended you make the following change in your medication:  INCREASE Metoprolol (Lopressor) to 50 mg twice a day

## 2011-03-16 NOTE — Assessment & Plan Note (Signed)
Symptoms are reasonably well controlled.  Further improvement will be sought by increasing metoprolol to 50 mg twice a day.  As long as Joshua Perez continues to do well, he does not require routine cardiology followup.  I will happily see him again at any time with Dr. Gerda Diss so requests.

## 2011-03-16 NOTE — Assessment & Plan Note (Signed)
Blood pressure is adequately controlled with current medication, which will be continued Low-dose beta blocker and ACE inhibitor or his only antihypertensive medications.

## 2011-08-27 ENCOUNTER — Ambulatory Visit (INDEPENDENT_AMBULATORY_CARE_PROVIDER_SITE_OTHER): Payer: Medicare Other | Admitting: Gastroenterology

## 2011-08-27 ENCOUNTER — Other Ambulatory Visit: Payer: Self-pay | Admitting: Internal Medicine

## 2011-08-27 ENCOUNTER — Encounter: Payer: Self-pay | Admitting: Gastroenterology

## 2011-08-27 ENCOUNTER — Encounter: Payer: Self-pay | Admitting: Internal Medicine

## 2011-08-27 VITALS — BP 110/61 | HR 67 | Temp 97.2°F | Ht 74.0 in | Wt 276.2 lb

## 2011-08-27 DIAGNOSIS — Z860100 Personal history of colon polyps, unspecified: Secondary | ICD-10-CM

## 2011-08-27 DIAGNOSIS — R197 Diarrhea, unspecified: Secondary | ICD-10-CM

## 2011-08-27 DIAGNOSIS — K529 Noninfective gastroenteritis and colitis, unspecified: Secondary | ICD-10-CM

## 2011-08-27 MED ORDER — PEG 3350-KCL-NA BICARB-NACL 420 G PO SOLR: ORAL | Status: AC

## 2011-08-27 NOTE — Progress Notes (Signed)
Faxed to PCP

## 2011-08-27 NOTE — Progress Notes (Signed)
Primary Care Physician:  LUKING,W S, MD  Primary Gastroenterologist:  Michael Rourk, MD   Chief Complaint  Patient presents with  . Colonoscopy  . Diarrhea    HPI:  Joshua Perez is a 60 y.o. male here for further evaluation of chronic diarrhea. Patient presents by himself today. He seems to have some issues with memory. He does not recall if he made this appointment.  Chronic diarrhea with 5-6 loose BMs per day for past 6 months. Lomotil did not help. Nocturnal episodes. No melena, brbpr. No abdominal pain. No weight loss. No n/v. Appetite good. Occasional heartburn. No dysphagia. Imodium slows down stool for a day, but diarrhea comes back. No well water. No medication changes. No ill contacts. No travel abroad. No constipation. Wife currently undergoing chemotherapy for breast cancer.  Current Outpatient Prescriptions  Medication Sig Dispense Refill  . ALPRAZolam (XANAX PO) Take 1 tablet by mouth 2 (two) times daily.       . Ascorbic Acid (VITAMIN C) 100 MG tablet Take 100 mg by mouth daily.        . Asenapine Maleate 10 MG SUBL Place under the tongue.        . aspirin 81 MG tablet Take 81 mg by mouth daily.        . Cholecalciferol (VITAMIN D-3 PO) Take by mouth daily.        . diazepam (VALIUM) 5 MG tablet Take 5 mg by mouth every 6 (six) hours as needed.        . finasteride (PROSCAR) 5 MG tablet Take 5 mg by mouth daily.        . glyBURIDE (DIABETA) 5 MG tablet Take 5 mg by mouth 2 (two) times daily with a meal.       . levothyroxine (SYNTHROID, LEVOTHROID) 100 MCG tablet Take 100 mcg by mouth daily.       . lithium carbonate 300 MG capsule Take 300 mg by mouth as directed. 3 in the pm 2 in the am      . loperamide (IMODIUM) 2 MG capsule Take 2 mg by mouth 4 (four) times daily as needed. Takes two every other day.      . metFORMIN (GLUCOPHAGE) 500 MG tablet Take 500 mg by mouth 2 (two) times daily with a meal.        . metoprolol (LOPRESSOR) 50 MG tablet Take 1 tablet (50 mg  total) by mouth 2 (two) times daily.  180 tablet  3  . moexipril (UNIVASC) 15 MG tablet Take 30 mg by mouth at bedtime.        . multivitamin (THERAGRAN) per tablet Take 1 tablet by mouth daily.        . simvastatin (ZOCOR) 20 MG tablet Take 20 mg by mouth at bedtime.        . Tamsulosin HCl (FLOMAX) 0.4 MG CAPS Take 0.8 mg by mouth daily after supper.        . traMADol (ULTRAM) 50 MG tablet       . traZODone (DESYREL) 100 MG tablet Take 100 mg by mouth at bedtime. 2 tabs at bedtime      . venlafaxine (EFFEXOR XR) 150 MG 24 hr capsule Take 150 mg by mouth 2 (two) times daily.        . DISCONTD: levothyroxine (SYNTHROID, LEVOTHROID) 75 MCG tablet Take 100 mcg by mouth daily.         Allergies as of 08/27/2011 - Review Complete 08/27/2011  Allergen Reaction   Noted  . Penicillins Rash 11/20/2010  . Bee venom  08/27/2011    Past Medical History  Diagnosis Date  . Diabetes mellitus, type 2 1980    Recent hemoglobin A1c reportedly 5.3; controlled with single oral agent  . Hypertension 1995  . Erectile dysfunction   . Asthma   . Bipolar disorder 1965    Onset in childhood; remote history of ECT  . Palpitations     Echocardiogram normal in 07/2007 except for mild LVH; Event recorder-first degree AV block, no symptomatic spells, no significant arrhythmias; stress nuclear negative in 1996    Past Surgical History  Procedure Date  . Hernia repair 1956    , right inguinal  . Appendectomy   . Tonsillectomy 1957  . Colonoscopy w/ polypectomy 2002    Dr. Malcolm Stark-->6mm sessile trv colon polyp, path unavailable  . Nasal septum surgery 1974  . Vasectomy 1988    ?  Subsequent reversal    Family History  Problem Relation Age of Onset  . Diabetes Father   . Heart attack Father   . Alzheimer's disease Mother   . Colon cancer Neg Hx   . Liver disease Neg Hx   . Inflammatory bowel disease Neg Hx     History   Social History  . Marital Status: Married    Spouse Name: N/A     Number of Children: 3  . Years of Education: N/A   Occupational History  . Disability     Psychiatric   Social History Main Topics  . Smoking status: Never Smoker   . Smokeless tobacco: Not on file  . Alcohol Use: No  . Drug Use: No  . Sexually Active: Not on file   Other Topics Concern  . Not on file   Social History Narrative  . No narrative on file      ROS:  General: Negative for anorexia, weight loss, fever, chills.complains of fatigue, weakness. Eyes: Negative for vision changes.  ENT: Negative for hoarseness, difficulty swallowing , nasal congestion. CV: Negative for chest pain, angina, palpitations, dyspnea on exertion, peripheral edema.  Respiratory: Negative for dyspnea at rest, dyspnea on exertion, cough, sputum, wheezing.  GI: See history of present illness. GU:  Negative for dysuria, hematuria, urinary incontinence, urinary frequency, nocturnal urination.  MS: Negative for joint pain, low back pain.  Derm: Negative for rash or itching.  Neuro: Negative for weakness, abnormal sensation, seizure, frequent headaches, memory loss, confusion.  Psych: Negative for anxiety, depression, suicidal ideation, hallucinations. History of bipolar disorder, patient states controlled. Endo: Negative for unusual weight change.  Heme: Negative for bruising or bleeding. Allergy: Negative for rash or hives.    Physical Examination:  BP 110/61  Pulse 67  Temp 97.2 F (36.2 C) (Temporal)  Ht 6' 2" (1.88 m)  Wt 276 lb 3.2 oz (125.283 kg)  BMI 35.46 kg/m2   General: Well-nourished, well-developed in no acute distress. Flat affect. Pale appearing. Head: Normocephalic, atraumatic.   Eyes: Conjunctiva pale, no icterus. Mouth: Oropharyngeal mucosa moist and pink , no lesions erythema or exudate. Neck: Supple without thyromegaly, masses, or lymphadenopathy.  Lungs: Clear to auscultation bilaterally.  Heart: Regular rate and rhythm, no murmurs rubs or gallops.  Abdomen: Bowel  sounds are normal, nontender, nondistended, no hepatosplenomegaly or masses, no abdominal bruits or    hernia , no rebound or guarding.   Rectal: deferred to time of colonoscopy. Extremities: No lower extremity edema. No clubbing or deformities.  Neuro: Alert and oriented   x 4 , grossly normal neurologically.  Skin: Warm and dry, no rash or jaundice.   Psych: Alert and cooperative, normal mood and affect.  Labs:     

## 2011-08-27 NOTE — Assessment & Plan Note (Addendum)
Chronic diarrhea. Doubt infectious etiology at this point. Patient was hospitalized one year ago with diarrhea but he does not even remember being in the hospital. At that time stool studies were negative. He is having nocturnal diarrhea. Differential diagnosis broad including microscopic/collagenous colitis, inflammatory bowel disease, celiac disease, diabetic enteropathy. He also has a history of colon polyp, path unavailable back in 2002. At this time recommend colonoscopy with possible random colon biopsies. I will also check CBC, TSH, CMET, TTG and IgA.   Continue imodium for diarrhea. Take no more than three daily.

## 2011-08-27 NOTE — Patient Instructions (Signed)
  We have scheduled you for a colonoscopy with Dr. Jena Gauss. Please see separate instructions provided. You'll need to take half your regular dose of diabetes medications (glyburide and metformin) the day you were taking the bowel prep. Please have your blood work done (CBC, TSH, CMET, celiac screen). You may continue imodium for diarrhea. Take no more than three daily.

## 2011-09-04 LAB — CBC WITH DIFFERENTIAL/PLATELET
Lymphocytes Relative: 22 % (ref 12–46)
Lymphs Abs: 2.3 10*3/uL (ref 0.7–4.0)
Neutrophils Relative %: 61 % (ref 43–77)
Platelets: 355 10*3/uL (ref 150–400)
RBC: 4.66 MIL/uL (ref 4.22–5.81)
WBC: 10.4 10*3/uL (ref 4.0–10.5)

## 2011-09-04 LAB — COMPREHENSIVE METABOLIC PANEL
ALT: 76 U/L — ABNORMAL HIGH (ref 0–53)
AST: 48 U/L — ABNORMAL HIGH (ref 0–37)
CO2: 21 mEq/L (ref 19–32)
Calcium: 10 mg/dL (ref 8.4–10.5)
Chloride: 105 mEq/L (ref 96–112)
Sodium: 138 mEq/L (ref 135–145)
Total Bilirubin: 0.5 mg/dL (ref 0.3–1.2)
Total Protein: 7 g/dL (ref 6.0–8.3)

## 2011-09-04 LAB — TSH: TSH: 3.52 u[IU]/mL (ref 0.350–4.500)

## 2011-09-05 LAB — TISSUE TRANSGLUTAMINASE, IGA: Tissue Transglutaminase Ab, IgA: 5.2 U/mL (ref <20)

## 2011-09-06 NOTE — Progress Notes (Signed)
Quick Note:  TTG ok. IgA pending. Unlikely has celiac. CBC, TSH ok. New AST/ALT elevation mild. ?secondary to medication? Let's repeat LFTs in 6 weeks. ______

## 2011-09-07 ENCOUNTER — Encounter (HOSPITAL_COMMUNITY): Payer: Self-pay | Admitting: Pharmacy Technician

## 2011-09-07 LAB — IGA: IgA: 320 mg/dL (ref 68–379)

## 2011-09-10 ENCOUNTER — Other Ambulatory Visit: Payer: Self-pay | Admitting: Gastroenterology

## 2011-09-10 DIAGNOSIS — R7989 Other specified abnormal findings of blood chemistry: Secondary | ICD-10-CM

## 2011-09-11 ENCOUNTER — Ambulatory Visit (INDEPENDENT_AMBULATORY_CARE_PROVIDER_SITE_OTHER): Payer: Medicare Other | Admitting: Urology

## 2011-09-11 DIAGNOSIS — R972 Elevated prostate specific antigen [PSA]: Secondary | ICD-10-CM

## 2011-09-11 DIAGNOSIS — R350 Frequency of micturition: Secondary | ICD-10-CM

## 2011-09-11 DIAGNOSIS — F980 Enuresis not due to a substance or known physiological condition: Secondary | ICD-10-CM

## 2011-09-11 DIAGNOSIS — N3941 Urge incontinence: Secondary | ICD-10-CM

## 2011-09-20 MED ORDER — SODIUM CHLORIDE 0.45 % IV SOLN
Freq: Once | INTRAVENOUS | Status: AC
  Administered 2011-09-21: 10:00:00 via INTRAVENOUS

## 2011-09-20 MED ORDER — PROMETHAZINE HCL 25 MG/ML IJ SOLN
25.0000 mg | Freq: Once | INTRAMUSCULAR | Status: AC
  Administered 2011-09-21: 25 mg via INTRAVENOUS

## 2011-09-21 ENCOUNTER — Encounter (HOSPITAL_COMMUNITY)
Admission: RE | Disposition: A | Discharge status: Home / Self Care | Payer: Self-pay | Source: Ambulatory Visit | Attending: Internal Medicine

## 2011-09-21 ENCOUNTER — Ambulatory Visit (HOSPITAL_COMMUNITY)
Admission: RE | Admit: 2011-09-21 | Discharge: 2011-09-21 | Disposition: A | Discharge status: Home / Self Care | Payer: Medicare Other | Source: Ambulatory Visit | Attending: Internal Medicine | Admitting: Internal Medicine

## 2011-09-21 ENCOUNTER — Encounter (HOSPITAL_COMMUNITY): Payer: Self-pay | Admitting: *Deleted

## 2011-09-21 DIAGNOSIS — R197 Diarrhea, unspecified: Secondary | ICD-10-CM

## 2011-09-21 DIAGNOSIS — D126 Benign neoplasm of colon, unspecified: Secondary | ICD-10-CM

## 2011-09-21 DIAGNOSIS — K529 Noninfective gastroenteritis and colitis, unspecified: Secondary | ICD-10-CM

## 2011-09-21 DIAGNOSIS — I10 Essential (primary) hypertension: Secondary | ICD-10-CM | POA: Insufficient documentation

## 2011-09-21 DIAGNOSIS — K5730 Diverticulosis of large intestine without perforation or abscess without bleeding: Secondary | ICD-10-CM

## 2011-09-21 DIAGNOSIS — E119 Type 2 diabetes mellitus without complications: Secondary | ICD-10-CM | POA: Insufficient documentation

## 2011-09-21 DIAGNOSIS — Z79899 Other long term (current) drug therapy: Secondary | ICD-10-CM | POA: Insufficient documentation

## 2011-09-21 DIAGNOSIS — Z86010 Personal history of colonic polyps: Secondary | ICD-10-CM

## 2011-09-21 DIAGNOSIS — Z01812 Encounter for preprocedural laboratory examination: Secondary | ICD-10-CM | POA: Insufficient documentation

## 2011-09-21 HISTORY — PX: BIOPSY: SHX5522

## 2011-09-21 HISTORY — PX: COLONOSCOPY: SHX5424

## 2011-09-21 LAB — GLUCOSE, CAPILLARY: Glucose-Capillary: 192 mg/dL — ABNORMAL HIGH (ref 70–99)

## 2011-09-21 SURGERY — COLONOSCOPY
Anesthesia: Moderate Sedation

## 2011-09-21 MED ORDER — PROMETHAZINE HCL 25 MG/ML IJ SOLN
INTRAMUSCULAR | Status: AC
  Filled 2011-09-21: qty 1

## 2011-09-21 MED ORDER — SODIUM CHLORIDE 0.9 % IJ SOLN
INTRAMUSCULAR | Status: AC
  Filled 2011-09-21: qty 10

## 2011-09-21 MED ORDER — MEPERIDINE HCL 100 MG/ML IJ SOLN
INTRAMUSCULAR | Status: AC
  Filled 2011-09-21: qty 2

## 2011-09-21 MED ORDER — MEPERIDINE HCL 100 MG/ML IJ SOLN
INTRAMUSCULAR | Status: DC | PRN
  Administered 2011-09-21: 25 mg via INTRAVENOUS
  Administered 2011-09-21: 50 mg via INTRAVENOUS
  Administered 2011-09-21: 25 mg via INTRAVENOUS

## 2011-09-21 MED ORDER — SIMETHICONE 40 MG/0.6ML PO SUSP
ORAL | Status: DC | PRN
  Administered 2011-09-21: 11:00:00

## 2011-09-21 MED ORDER — MIDAZOLAM HCL 5 MG/5ML IJ SOLN
INTRAMUSCULAR | Status: DC | PRN
  Administered 2011-09-21 (×2): 1 mg via INTRAVENOUS
  Administered 2011-09-21 (×2): 2 mg via INTRAVENOUS

## 2011-09-21 MED ORDER — MIDAZOLAM HCL 5 MG/5ML IJ SOLN
INTRAMUSCULAR | Status: AC
  Filled 2011-09-21: qty 10

## 2011-09-21 NOTE — H&P (View-Only) (Signed)
Primary Care Physician:  Harlow Asa, MD  Primary Gastroenterologist:  Roetta Sessions, MD   Chief Complaint  Patient presents with  . Colonoscopy  . Diarrhea    HPI:  Joshua Perez is a 61 y.o. male here for further evaluation of chronic diarrhea. Patient presents by himself today. He seems to have some issues with memory. He does not recall if he made this appointment.  Chronic diarrhea with 5-6 loose BMs per day for past 6 months. Lomotil did not help. Nocturnal episodes. No melena, brbpr. No abdominal pain. No weight loss. No n/v. Appetite good. Occasional heartburn. No dysphagia. Imodium slows down stool for a day, but diarrhea comes back. No well water. No medication changes. No ill contacts. No travel abroad. No constipation. Wife currently undergoing chemotherapy for breast cancer.  Current Outpatient Prescriptions  Medication Sig Dispense Refill  . ALPRAZolam (XANAX PO) Take 1 tablet by mouth 2 (two) times daily.       . Ascorbic Acid (VITAMIN C) 100 MG tablet Take 100 mg by mouth daily.        . Asenapine Maleate 10 MG SUBL Place under the tongue.        Marland Kitchen aspirin 81 MG tablet Take 81 mg by mouth daily.        . Cholecalciferol (VITAMIN D-3 PO) Take by mouth daily.        . diazepam (VALIUM) 5 MG tablet Take 5 mg by mouth every 6 (six) hours as needed.        . finasteride (PROSCAR) 5 MG tablet Take 5 mg by mouth daily.        Marland Kitchen glyBURIDE (DIABETA) 5 MG tablet Take 5 mg by mouth 2 (two) times daily with a meal.       . levothyroxine (SYNTHROID, LEVOTHROID) 100 MCG tablet Take 100 mcg by mouth daily.       Marland Kitchen lithium carbonate 300 MG capsule Take 300 mg by mouth as directed. 3 in the pm 2 in the am      . loperamide (IMODIUM) 2 MG capsule Take 2 mg by mouth 4 (four) times daily as needed. Takes two every other day.      . metFORMIN (GLUCOPHAGE) 500 MG tablet Take 500 mg by mouth 2 (two) times daily with a meal.        . metoprolol (LOPRESSOR) 50 MG tablet Take 1 tablet (50 mg  total) by mouth 2 (two) times daily.  180 tablet  3  . moexipril (UNIVASC) 15 MG tablet Take 30 mg by mouth at bedtime.        . multivitamin (THERAGRAN) per tablet Take 1 tablet by mouth daily.        . simvastatin (ZOCOR) 20 MG tablet Take 20 mg by mouth at bedtime.        . Tamsulosin HCl (FLOMAX) 0.4 MG CAPS Take 0.8 mg by mouth daily after supper.        . traMADol (ULTRAM) 50 MG tablet       . traZODone (DESYREL) 100 MG tablet Take 100 mg by mouth at bedtime. 2 tabs at bedtime      . venlafaxine (EFFEXOR XR) 150 MG 24 hr capsule Take 150 mg by mouth 2 (two) times daily.        Marland Kitchen DISCONTD: levothyroxine (SYNTHROID, LEVOTHROID) 75 MCG tablet Take 100 mcg by mouth daily.         Allergies as of 08/27/2011 - Review Complete 08/27/2011  Allergen Reaction  Noted  . Penicillins Rash 11/20/2010  . Bee venom  08/27/2011    Past Medical History  Diagnosis Date  . Diabetes mellitus, type 2 1980    Recent hemoglobin A1c reportedly 5.3; controlled with single oral agent  . Hypertension 1995  . Erectile dysfunction   . Asthma   . Bipolar disorder 1965    Onset in childhood; remote history of ECT  . Palpitations     Echocardiogram normal in 07/2007 except for mild LVH; Event recorder-first degree AV block, no symptomatic spells, no significant arrhythmias; stress nuclear negative in 1996    Past Surgical History  Procedure Date  . Hernia repair 1956    , right inguinal  . Appendectomy   . Tonsillectomy 1957  . Colonoscopy w/ polypectomy 2002    Dr. Judie Petit Stark-->66mm sessile trv colon polyp, path unavailable  . Nasal septum surgery 1974  . Vasectomy 1988    ?  Subsequent reversal    Family History  Problem Relation Age of Onset  . Diabetes Father   . Heart attack Father   . Alzheimer's disease Mother   . Colon cancer Neg Hx   . Liver disease Neg Hx   . Inflammatory bowel disease Neg Hx     History   Social History  . Marital Status: Married    Spouse Name: N/A     Number of Children: 3  . Years of Education: N/A   Occupational History  . Disability     Psychiatric   Social History Main Topics  . Smoking status: Never Smoker   . Smokeless tobacco: Not on file  . Alcohol Use: No  . Drug Use: No  . Sexually Active: Not on file   Other Topics Concern  . Not on file   Social History Narrative  . No narrative on file      ROS:  General: Negative for anorexia, weight loss, fever, chills.complains of fatigue, weakness. Eyes: Negative for vision changes.  ENT: Negative for hoarseness, difficulty swallowing , nasal congestion. CV: Negative for chest pain, angina, palpitations, dyspnea on exertion, peripheral edema.  Respiratory: Negative for dyspnea at rest, dyspnea on exertion, cough, sputum, wheezing.  GI: See history of present illness. GU:  Negative for dysuria, hematuria, urinary incontinence, urinary frequency, nocturnal urination.  MS: Negative for joint pain, low back pain.  Derm: Negative for rash or itching.  Neuro: Negative for weakness, abnormal sensation, seizure, frequent headaches, memory loss, confusion.  Psych: Negative for anxiety, depression, suicidal ideation, hallucinations. History of bipolar disorder, patient states controlled. Endo: Negative for unusual weight change.  Heme: Negative for bruising or bleeding. Allergy: Negative for rash or hives.    Physical Examination:  BP 110/61  Pulse 67  Temp 97.2 F (36.2 C) (Temporal)  Ht 6\' 2"  (1.88 m)  Wt 276 lb 3.2 oz (125.283 kg)  BMI 35.46 kg/m2   General: Well-nourished, well-developed in no acute distress. Flat affect. Pale appearing. Head: Normocephalic, atraumatic.   Eyes: Conjunctiva pale, no icterus. Mouth: Oropharyngeal mucosa moist and pink , no lesions erythema or exudate. Neck: Supple without thyromegaly, masses, or lymphadenopathy.  Lungs: Clear to auscultation bilaterally.  Heart: Regular rate and rhythm, no murmurs rubs or gallops.  Abdomen: Bowel  sounds are normal, nontender, nondistended, no hepatosplenomegaly or masses, no abdominal bruits or    hernia , no rebound or guarding.   Rectal: deferred to time of colonoscopy. Extremities: No lower extremity edema. No clubbing or deformities.  Neuro: Alert and oriented  x 4 , grossly normal neurologically.  Skin: Warm and dry, no rash or jaundice.   Psych: Alert and cooperative, normal mood and affect.  Labs:

## 2011-09-21 NOTE — Interval H&P Note (Signed)
History and Physical Interval Note:  09/21/2011 10:32 AM  Joshua Perez  has presented today for surgery, with the diagnosis of CHRONIC DIARRHEA, HX OF COLON POLYPS  The various methods of treatment have been discussed with the patient and family. After consideration of risks, benefits and other options for treatment, the patient has consented to  Procedure(s) (LRB): COLONOSCOPY (N/A) BIOPSY (N/A) as a surgical intervention .  The patient's history has been reviewed, patient examined, no change in status, stable for surgery.  I have reviewed the patient's chart and labs.  Questions were answered to the patient's satisfaction.     Eula Listen

## 2011-09-21 NOTE — Op Note (Signed)
[facility] [address] [place], [place]  [address]  COLONOSCOPY PROCEDURE REPORT  PATIENT:  Joshua Perez, Joshua Perez  MR#:  [id number] BIRTHDATE:  14-Aug-1950, 60 yrs. old  GENDER:  male ENDOSCOPIST:  R. Roetta Sessions, MD FACP [facility] REF. BY:  Simone Curia, M.D. PROCEDURE DATE:  09/21/2011 PROCEDURE:  Colonoscopy with segmental biopsies and multiple snare polypectomies/polyp ablation  INDICATIONS:  History of chronic diarrhea-negative stool studies  INFORMED CONSENT:  The risks, benefits, alternatives and imponderables including but not limited to bleeding, perforation as well as the possibility of a missed lesion have been reviewed. The potential for biopsy, lesion removal, etc. have also been discussed.  Questions have been answered.  All parties agreeable. Please see the history and physical in the medical record for more information.  MEDICATIONS:  Versed 6 mg IV and Demerol 100 mg IV in divided doses. Phenergan 25 mg IV to augment conscious sedation.  DESCRIPTION OF PROCEDURE:  After a digital rectal exam was performed, the EC-3890Li ([id number]) colonoscope was advanced from the anus through the rectum and colon to the area of the cecum, ileocecal valve and appendiceal orifice.  The cecum was deeply intubated.  These structures were well-seen and photographed for the record.  From the level of the cecum and ileocecal valve, the scope was slowly and cautiously withdrawn.  The mucosal surfaces were carefully surveyed utilizing scope tip deflection to facilitate fold flattening as needed.  The scope was pulled down into the rectum where a thorough examination including retroflexion was performed. <<PROCEDUREIMAGES>>  FINDINGS:  Marginal prep made exam more challenging. Patient has moderately lax sphincter tone. Normal appearing rectal mucosa. Tortuous elongated colon scattered left-sided diverticula. Multiple colonic polyps in the area of the distal transverse, hepatic  flexure segments and a single polyp opposite the ileocecal valve. The largest polyp, approximately 9 mm in dimensions, was in the distal transverse segment.. A number of maneuvers had to be employed including changing of the patient's position and external abdominal pressure to reach the cecum.  THERAPEUTIC / DIAGNOSTIC MANEUVERS PERFORMED: Multiple hot snare polypectomies performed. Single diminutive polyp distal transverse colon was ablated with the tip of the hot snare loop.  Also, signal biopsies of the ascending and descending segments were taken to evaluate for microscopic colitis as a cause for diarrhea.  COMPLICATIONS:  One of the distal transverse colon polypectomy sites tended to ooz after the polyp was removed. I subsequently placed         2 hemostasis clips with good hemostasis being achieved with this maneuver.  CECAL WITHDRAWAL TIME: 20 minutes  IMPRESSION:    Colonic polyps-treated/removed as described above. Colonic diverticulosis. Lax sphincter time. Status post segmental biopsies       biopsies  RECOMMENDATIONS:    No MRI until clips noted to have passed. No aspirin for 10 days. Followup on pathology.  ______________________________ R. Roetta Sessions, MD Caleen Essex  CC:  Simone Curia, M.D.  n. eSIGNED:   R. Roetta Sessions at 09/21/2011 12:07 PM  Truddie Hidden, [phone]

## 2011-09-25 ENCOUNTER — Encounter: Payer: Self-pay | Admitting: Internal Medicine

## 2011-09-25 ENCOUNTER — Encounter: Payer: Self-pay | Admitting: *Deleted

## 2011-09-25 NOTE — Progress Notes (Unsigned)
Joshua Perez, please nic ov in 3 months [name], please cc pcp   Letter from: Corbin Ade   Reason for Letter: Results Review   Send letter to patient.  Send copy of letter with path to referring provider and PCP.  patient needs an extra 2 L of GoLYTELY prep and an extra half a day clear liquids;please schedule extra time for this procedure. Office visit in 3 months to set up TCS

## 2011-09-25 NOTE — Progress Notes (Signed)
Letter and path faxed to PCP, letter mailed to pt, 3 mo OV in recall

## 2011-09-26 ENCOUNTER — Other Ambulatory Visit: Payer: Self-pay

## 2011-09-26 DIAGNOSIS — R7989 Other specified abnormal findings of blood chemistry: Secondary | ICD-10-CM

## 2011-09-27 ENCOUNTER — Encounter (HOSPITAL_COMMUNITY): Payer: Self-pay | Admitting: Internal Medicine

## 2011-10-10 LAB — HEPATIC FUNCTION PANEL
ALT: 87 U/L — ABNORMAL HIGH (ref 0–53)
AST: 53 U/L — ABNORMAL HIGH (ref 0–37)
Albumin: 4.7 g/dL (ref 3.5–5.2)
Alkaline Phosphatase: 83 U/L (ref 39–117)
Indirect Bilirubin: 0.4 mg/dL (ref 0.0–0.9)
Total Protein: 7.6 g/dL (ref 6.0–8.3)

## 2011-10-11 NOTE — Progress Notes (Signed)
Quick Note:  AST/ALT still mildly elevated. Need to check iron/tibc, ferritin, hepatitis B surface antigen, Hep C antibody. Need abd u/s. ______

## 2011-10-12 ENCOUNTER — Other Ambulatory Visit: Payer: Self-pay | Admitting: Gastroenterology

## 2011-10-12 ENCOUNTER — Other Ambulatory Visit: Payer: Self-pay

## 2011-10-12 DIAGNOSIS — B1920 Unspecified viral hepatitis C without hepatic coma: Secondary | ICD-10-CM

## 2011-10-12 DIAGNOSIS — R748 Abnormal levels of other serum enzymes: Secondary | ICD-10-CM

## 2011-10-12 NOTE — Progress Notes (Signed)
Patient is scheduled for Abd U/S arrive at 7:45 NPO after midnight and patient is aware

## 2011-10-12 NOTE — Progress Notes (Signed)
Quick Note:  Pt aware, lab order faxed to lab.  Leighann, please schedule U/S, pt would like to have it soon. ______

## 2011-10-18 ENCOUNTER — Ambulatory Visit (HOSPITAL_COMMUNITY)
Admission: RE | Admit: 2011-10-18 | Discharge: 2011-10-18 | Disposition: A | Discharge status: Home / Self Care | Payer: Medicare Other | Source: Ambulatory Visit | Attending: Gastroenterology | Admitting: Gastroenterology

## 2011-10-18 DIAGNOSIS — B1920 Unspecified viral hepatitis C without hepatic coma: Secondary | ICD-10-CM | POA: Insufficient documentation

## 2011-10-18 NOTE — Progress Notes (Signed)
Quick Note:  Pt aware, he stated he had the blood work done on Friday with [employer] [facility] [employer]. I will check with them and see if I can get results. ______

## 2011-10-18 NOTE — Progress Notes (Signed)
Quick Note:  Fatty liver.  Await other pending labs. ______

## 2011-10-30 ENCOUNTER — Ambulatory Visit (INDEPENDENT_AMBULATORY_CARE_PROVIDER_SITE_OTHER): Payer: Medicare Other | Admitting: Urology

## 2011-10-30 DIAGNOSIS — R3915 Urgency of urination: Secondary | ICD-10-CM

## 2011-11-06 LAB — HEPATITIS C ANTIBODY: HCV Ab: NEGATIVE

## 2011-11-14 NOTE — Progress Notes (Signed)
Quick Note:  Viral markers negative. No elevated iron. Ferritin did not get ordered.  abd u/s showed fatty liver.   Instructions for fatty liver: Recommend 1-2# weight loss per week until ideal body weight through exercise & diet. Low fat/cholesterol diet. Gradually increase exercise from 15 min daily up to 1 hr per day 5 days/week. Limit alcohol use.  Check LFTs in six months. ______

## 2011-11-22 ENCOUNTER — Encounter: Payer: Self-pay | Admitting: *Deleted

## 2011-12-20 ENCOUNTER — Encounter: Payer: Self-pay | Admitting: Internal Medicine

## 2011-12-24 ENCOUNTER — Ambulatory Visit: Payer: Medicare Other | Admitting: Gastroenterology

## 2011-12-26 ENCOUNTER — Encounter: Payer: Self-pay | Admitting: Gastroenterology

## 2011-12-26 ENCOUNTER — Ambulatory Visit (INDEPENDENT_AMBULATORY_CARE_PROVIDER_SITE_OTHER): Payer: Medicare Other | Admitting: Gastroenterology

## 2011-12-26 VITALS — BP 114/64 | HR 58 | Temp 98.1°F | Ht 74.0 in | Wt 270.4 lb

## 2011-12-26 DIAGNOSIS — Z86010 Personal history of colonic polyps: Secondary | ICD-10-CM

## 2011-12-26 DIAGNOSIS — K5900 Constipation, unspecified: Secondary | ICD-10-CM

## 2011-12-26 MED ORDER — PEG 3350-KCL-NA BICARB-NACL 420 G PO SOLR: 6000.0000 mL | ORAL | Status: DC

## 2011-12-26 MED ORDER — POLYETHYLENE GLYCOL 3350 17 GM/SCOOP PO POWD: 17.0000 g | Freq: Every day | ORAL | Status: DC | PRN

## 2011-12-26 NOTE — Progress Notes (Signed)
Primary Care Physician:  LUKING,W S, MD  Primary Gastroenterologist:  Michael Rourk, MD   Chief Complaint  Patient presents with  . Colonoscopy    HPI:  Joshua Perez is a 61 y.o. male here to schedule short interval followup colonoscopy. He had a recent colonoscopy in August 2013 with suboptimal prep. He had multiple polyps removed some more advanced tubulovillous adenomas. Dr. Rourk recommended a three-month followup colonoscopy with more extensive bowel prep. Patient reports that he is no longer having diarrhea. Now may be having BM once every 3-4 days. No melena, brbpr. No abdominal pain. Appetite good. No heartburn, dysphagia. He reports taking his trial and the prep per instructions previously. He took the whole gallon. He reports that his stools never became clear.   Current Outpatient Prescriptions  Medication Sig Dispense Refill  . alprazolam (XANAX) 2 MG tablet Take 1 tablet by mouth 2 (two) times daily.      . Ascorbic Acid (VITAMIN C) 100 MG tablet Take 100 mg by mouth daily.        . Asenapine Maleate 10 MG SUBL Place 1 tablet under the tongue daily.       . aspirin 81 MG tablet Take 81 mg by mouth daily.        . Cholecalciferol (VITAMIN D-3 PO) Take by mouth daily.        . diazepam (VALIUM) 5 MG tablet Take 5 mg by mouth every 6 (six) hours as needed. Anxiety      . finasteride (PROSCAR) 5 MG tablet Take 5 mg by mouth daily.        . glyBURIDE (DIABETA) 5 MG tablet Take 5 mg by mouth 2 (two) times daily.       . levothyroxine (SYNTHROID, LEVOTHROID) 100 MCG tablet Take 100 mcg by mouth daily.       . lithium carbonate 300 MG capsule Take 600-900 mg by mouth as directed. 600 mg in the morning and 900 mg in the evening,.      . metFORMIN (GLUCOPHAGE) 500 MG tablet Take 500 mg by mouth 2 (two) times daily with a meal.        . metoprolol (LOPRESSOR) 50 MG tablet Take 1 tablet (50 mg total) by mouth 2 (two) times daily.  180 tablet  3  . moexipril (UNIVASC) 15 MG tablet Take  30 mg by mouth at bedtime.        . multivitamin (THERAGRAN) per tablet Take 1 tablet by mouth daily.        . Tamsulosin HCl (FLOMAX) 0.4 MG CAPS Take 0.4 mg by mouth daily after supper.      . traMADol (ULTRAM) 50 MG tablet Take 50 mg by mouth every 6 (six) hours as needed. Pain      . traZODone (DESYREL) 100 MG tablet Take 200 mg by mouth at bedtime.       . venlafaxine (EFFEXOR XR) 150 MG 24 hr capsule Take 150 mg by mouth 2 (two) times daily.        .      3    Allergies as of 12/26/2011 - Review Complete 12/26/2011  Allergen Reaction Noted  . Bee venom Anaphylaxis 08/27/2011  . Penicillins Shortness Of Breath, Swelling, and Rash 11/20/2010    Past Medical History  Diagnosis Date  . Diabetes mellitus, type 2 1980    Recent hemoglobin A1c reportedly 5.3; controlled with single oral agent  . Hypertension 1995  . Erectile dysfunction   .   Asthma   . Bipolar disorder 1965    Onset in childhood; remote history of ECT  . Palpitations     Echocardiogram normal in 07/2007 except for mild LVH; Event recorder-first degree AV block, no symptomatic spells, no significant arrhythmias; stress nuclear negative in 1996    Past Surgical History  Procedure Date  . Hernia repair 1956    , right inguinal  . Appendectomy   . Tonsillectomy 1957  . Colonoscopy w/ polypectomy 2002    Dr. Malcolm Stark-->6mm sessile trv colon polyp, path unavailable  . Nasal septum surgery 1974  . Vasectomy 1988    ?  Subsequent reversal  . Colonoscopy 09/21/2011    RMR: Colonic polyps-treated/removed as described above/ Colonic diverticulosis. Lax sphincter time. Status post segmental biopsies(TUBULOVILLOUS ADENOMA/TUBULAR ADENOMA). Prep marginal. TCS 3 months recommended  . Esophageal biopsy 09/21/2011    Procedure: BIOPSY;  Surgeon: Robert M Rourk, MD;  Location: [facility];  Service: Endoscopy;  Laterality: N/A;  WITH RANDOM BIOPSY    Family History  Problem Relation Age of Onset  . Diabetes Father   .  Heart attack Father   . Alzheimer's disease Mother   . Colon cancer Neg Hx   . Liver disease Neg Hx   . Inflammatory bowel disease Neg Hx     History   Social History  . Marital Status: Married    Spouse Name: N/A    Number of Children: 3  . Years of Education: N/A   Occupational History  . Disability     Psychiatric   Social History Main Topics  . Smoking status: Never Smoker   . Smokeless tobacco: Not on file  . Alcohol Use: No  . Drug Use: No  . Sexually Active: Not on file   Other Topics Concern  . Not on file   Social History Narrative  . No narrative on file      ROS:  General: Negative for anorexia, weight loss, fever, chills. Complains of chronic fatigue and weakness. Eyes: Negative for vision changes.  ENT: Negative for hoarseness, difficulty swallowing , nasal congestion. CV: Negative for chest pain, angina, palpitations, dyspnea on exertion, peripheral edema.  Respiratory: Negative for dyspnea at rest, dyspnea on exertion, cough, sputum, wheezing.  GI: See history of present illness. GU:  Negative for dysuria, hematuria, urinary incontinence, urinary frequency, nocturnal urination.  MS: Negative for joint pain, low back pain.  Derm: Negative for rash or itching.  Neuro: Negative for weakness, abnormal sensation, seizure, frequent headaches, memory loss, confusion.  Psych: Negative for anxiety, depression, suicidal ideation, hallucinations.  Endo: Negative for unusual weight change.  Heme: Negative for bruising or bleeding. Allergy: Negative for rash or hives.    Physical Examination:  BP 114/64  Pulse 58  Temp 98.1 F (36.7 C) (Temporal)  Ht 6' 2" (1.88 m)  Wt 270 lb 6.4 oz (122.653 kg)  BMI 34.72 kg/m2   General: Well-nourished, well-developed in no acute distress. Flat affect. Head: Normocephalic, atraumatic.   Eyes: Conjunctiva pink, no icterus. Mouth: Oropharyngeal mucosa moist and pink , no lesions erythema or exudate. Neck: Supple  without thyromegaly, masses, or lymphadenopathy.  Lungs: Clear to auscultation bilaterally.  Heart: Regular rate and rhythm, no murmurs rubs or gallops.  Abdomen: Bowel sounds are normal, nontender, nondistended, no hepatosplenomegaly or masses, no abdominal bruits or    hernia , no rebound or guarding.   Rectal: deferred Extremities: No lower extremity edema. No clubbing or deformities.  Neuro: Alert and oriented   x 4 , grossly normal neurologically.  Skin: Warm and dry, no rash or jaundice.   Psych: Alert and cooperative, normal mood and affect.   

## 2011-12-26 NOTE — Assessment & Plan Note (Addendum)
Due for short interval surveillance colonoscopy given multiple colonic polyps, tubulovillous adenomas, poor bowel prep. He will have an extra half a day of clear liquids. He will also get an extra 2 L of GoLYTELY, for a total of 6 L. He will also receive Dulcolax 10 mg orally daily 2 days leading into his bowel prep including what comes with the bowel prep. He will begin MiraLax 17 g daily now. As before he will receive Phenergan 25 mg IV 30 minutes before the procedure to augment conscious sedation in light of his polypharmacy. Regarding his diabetic medications he will take half dose of glyburide and metformin in the morning of the bowel prep and none in the evening prior to procedure.  I have discussed the risks, alternatives, benefits with regards to but not limited to the risk of reaction to medication, bleeding, infection, perforation and the patient is agreeable to proceed. Written consent to be obtained.

## 2011-12-26 NOTE — Patient Instructions (Addendum)
We have scheduled you for an early followup colonoscopy given that you had multiple polyps and your preparation was poor last time.  Please see separate instructions provided for your bowel prep.    The day you take your bowel prep, you will take glyburide 2.5 mg in the morning only and metformin 250 mg in the morning only. Hold the evening doses.  You will begin Dulcolax 10 mg orally daily starting 2 days before your bowel prep.  Please start MiraLax 17 g daily as needed for constipation. Please take a dose at bedtime on days you have not had a good bowel movement.

## 2011-12-26 NOTE — Progress Notes (Signed)
Faxed to PCP

## 2012-01-07 ENCOUNTER — Encounter (HOSPITAL_COMMUNITY): Payer: Self-pay | Admitting: Pharmacy Technician

## 2012-01-17 MED ORDER — SODIUM CHLORIDE 0.45 % IV SOLN
INTRAVENOUS | Status: DC
  Administered 2012-01-18: 08:00:00 via INTRAVENOUS

## 2012-01-18 ENCOUNTER — Encounter (HOSPITAL_COMMUNITY): Payer: Self-pay | Admitting: *Deleted

## 2012-01-18 ENCOUNTER — Ambulatory Visit (HOSPITAL_COMMUNITY)
Admission: RE | Admit: 2012-01-18 | Discharge: 2012-01-18 | Disposition: A | Discharge status: Home / Self Care | Payer: Medicare Other | Source: Ambulatory Visit | Attending: Internal Medicine | Admitting: Internal Medicine

## 2012-01-18 ENCOUNTER — Encounter (HOSPITAL_COMMUNITY)
Admission: RE | Disposition: A | Discharge status: Home / Self Care | Payer: Self-pay | Source: Ambulatory Visit | Attending: Internal Medicine

## 2012-01-18 DIAGNOSIS — Z86010 Personal history of colonic polyps: Secondary | ICD-10-CM

## 2012-01-18 DIAGNOSIS — J45909 Unspecified asthma, uncomplicated: Secondary | ICD-10-CM | POA: Insufficient documentation

## 2012-01-18 DIAGNOSIS — K5900 Constipation, unspecified: Secondary | ICD-10-CM

## 2012-01-18 DIAGNOSIS — E119 Type 2 diabetes mellitus without complications: Secondary | ICD-10-CM | POA: Insufficient documentation

## 2012-01-18 DIAGNOSIS — D128 Benign neoplasm of rectum: Secondary | ICD-10-CM | POA: Insufficient documentation

## 2012-01-18 DIAGNOSIS — I10 Essential (primary) hypertension: Secondary | ICD-10-CM | POA: Insufficient documentation

## 2012-01-18 DIAGNOSIS — D126 Benign neoplasm of colon, unspecified: Secondary | ICD-10-CM

## 2012-01-18 HISTORY — PX: COLONOSCOPY: SHX5424

## 2012-01-18 LAB — GLUCOSE, CAPILLARY: Glucose-Capillary: 205 mg/dL — ABNORMAL HIGH (ref 70–99)

## 2012-01-18 SURGERY — COLONOSCOPY
Anesthesia: Moderate Sedation

## 2012-01-18 MED ORDER — STERILE WATER FOR IRRIGATION IR SOLN
Status: DC | PRN
  Administered 2012-01-18: 08:00:00

## 2012-01-18 MED ORDER — MEPERIDINE HCL 100 MG/ML IJ SOLN
INTRAMUSCULAR | Status: DC | PRN
  Administered 2012-01-18: 25 mg via INTRAVENOUS
  Administered 2012-01-18: 50 mg via INTRAVENOUS
  Administered 2012-01-18 (×2): 25 mg via INTRAVENOUS

## 2012-01-18 MED ORDER — SODIUM CHLORIDE 0.9 % IJ SOLN
INTRAMUSCULAR | Status: AC
  Filled 2012-01-18: qty 10

## 2012-01-18 MED ORDER — MIDAZOLAM HCL 5 MG/5ML IJ SOLN
INTRAMUSCULAR | Status: AC
  Filled 2012-01-18: qty 10

## 2012-01-18 MED ORDER — MEPERIDINE HCL 100 MG/ML IJ SOLN
INTRAMUSCULAR | Status: AC
  Filled 2012-01-18: qty 2

## 2012-01-18 MED ORDER — PROMETHAZINE HCL 25 MG/ML IJ SOLN
25.0000 mg | Freq: Once | INTRAMUSCULAR | Status: AC
  Administered 2012-01-18: 25 mg via INTRAVENOUS

## 2012-01-18 MED ORDER — MIDAZOLAM HCL 5 MG/5ML IJ SOLN
INTRAMUSCULAR | Status: DC | PRN
  Administered 2012-01-18 (×3): 1 mg via INTRAVENOUS
  Administered 2012-01-18: 2 mg via INTRAVENOUS

## 2012-01-18 MED ORDER — PROMETHAZINE HCL 25 MG/ML IJ SOLN
INTRAMUSCULAR | Status: AC
  Filled 2012-01-18: qty 1

## 2012-01-18 NOTE — Op Note (Signed)
[facility] [address] [place], [address]   COLONOSCOPY PROCEDURE REPORT  PATIENT: Joshua Perez, Joshua Perez  MR#:         [id number] BIRTHDATE: 04-Oct-1950 , 61  yrs. old GENDER: Male ENDOSCOPIST: R.  Roetta Sessions, MD FACP FACG REFERRED BY:  Simone Curia, M.D. PROCEDURE DATE:  01/18/2012 PROCEDURE:     Colonoscopy with snare polypectomy  INDICATIONS: history of multiple high-grade adenomas removed his colon earlier this year in the setting of a poor prep  INFORMED CONSENT:  The risks, benefits, alternatives and imponderables including but not limited to bleeding, perforation as well as the possibility of a missed lesion have been reviewed.  The potential for biopsy, lesion removal, etc. have also been discussed.  Questions have been answered.  All parties agreeable. Please see the history and physical in the medical record for more information.  MEDICATIONS: Versed 5 mg IV Demerol125 mg IV in divided doses  DESCRIPTION OF PROCEDURE:  After a digital rectal exam was performed, the EC-3890Li ([id number])  colonoscope was advanced from the anus through the rectum and colon to the area of the cecum, ileocecal valve and appendiceal orifice.  The cecum was deeply intubated.  These structures were well-seen and photographed for the record.  From the level of the cecum and ileocecal valve, the scope was slowly and cautiously withdrawn.  The mucosal surfaces were carefully surveyed utilizing scope tip deflection to facilitate fold flattening as needed.  The scope was pulled down into the rectum where a thorough examination including retroflexion was performed.    FINDINGS:  again, marginal preparation. Normal rectumTIMI: (1) 5 mm polyp at the rectosigmoid junction; otherwise, the remainder of colonic history grossly normal however,  poor prep compromised examination.  THERAPEUTIC / DIAGNOSTIC MANEUVERS PERFORMED:  the above-mentioned polyp was cold snare  removed  COMPLICATIONS: None  CECAL WITHDRAWAL TIME:  14 minutes  IMPRESSION:  Colonic polyp-removed as described above. Poor preparation.  RECOMMENDATIONS: Followup on pathology.   _______________________________ eSigned:  R. Roetta Sessions, MD FACP [facility] 01/18/2012 9:16 AM   CC:

## 2012-01-18 NOTE — H&P (View-Only) (Signed)
Primary Care Physician:  Harlow Asa, MD  Primary Gastroenterologist:  Roetta Sessions, MD   Chief Complaint  Patient presents with  . Colonoscopy    HPI:  Joshua Perez is a 62 y.o. male here to schedule short interval followup colonoscopy. He had a recent colonoscopy in August 2013 with suboptimal prep. He had multiple polyps removed some more advanced tubulovillous adenomas. Dr. Jena Gauss recommended a three-month followup colonoscopy with more extensive bowel prep. Patient reports that he is no longer having diarrhea. Now may be having BM once every 3-4 days. No melena, brbpr. No abdominal pain. Appetite good. No heartburn, dysphagia. He reports taking his trial and the prep per instructions previously. He took the whole gallon. He reports that his stools never became clear.   Current Outpatient Prescriptions  Medication Sig Dispense Refill  . alprazolam (XANAX) 2 MG tablet Take 1 tablet by mouth 2 (two) times daily.      . Ascorbic Acid (VITAMIN C) 100 MG tablet Take 100 mg by mouth daily.        . Asenapine Maleate 10 MG SUBL Place 1 tablet under the tongue daily.       Marland Kitchen aspirin 81 MG tablet Take 81 mg by mouth daily.        . Cholecalciferol (VITAMIN D-3 PO) Take by mouth daily.        . diazepam (VALIUM) 5 MG tablet Take 5 mg by mouth every 6 (six) hours as needed. Anxiety      . finasteride (PROSCAR) 5 MG tablet Take 5 mg by mouth daily.        Marland Kitchen glyBURIDE (DIABETA) 5 MG tablet Take 5 mg by mouth 2 (two) times daily.       Marland Kitchen levothyroxine (SYNTHROID, LEVOTHROID) 100 MCG tablet Take 100 mcg by mouth daily.       Marland Kitchen lithium carbonate 300 MG capsule Take 600-900 mg by mouth as directed. 600 mg in the morning and 900 mg in the evening,.      . metFORMIN (GLUCOPHAGE) 500 MG tablet Take 500 mg by mouth 2 (two) times daily with a meal.        . metoprolol (LOPRESSOR) 50 MG tablet Take 1 tablet (50 mg total) by mouth 2 (two) times daily.  180 tablet  3  . moexipril (UNIVASC) 15 MG tablet Take  30 mg by mouth at bedtime.        . multivitamin (THERAGRAN) per tablet Take 1 tablet by mouth daily.        . Tamsulosin HCl (FLOMAX) 0.4 MG CAPS Take 0.4 mg by mouth daily after supper.      . traMADol (ULTRAM) 50 MG tablet Take 50 mg by mouth every 6 (six) hours as needed. Pain      . traZODone (DESYREL) 100 MG tablet Take 200 mg by mouth at bedtime.       Marland Kitchen venlafaxine (EFFEXOR XR) 150 MG 24 hr capsule Take 150 mg by mouth 2 (two) times daily.        .      3    Allergies as of 12/26/2011 - Review Complete 12/26/2011  Allergen Reaction Noted  . Bee venom Anaphylaxis 08/27/2011  . Penicillins Shortness Of Breath, Swelling, and Rash 11/20/2010    Past Medical History  Diagnosis Date  . Diabetes mellitus, type 2 1980    Recent hemoglobin A1c reportedly 5.3; controlled with single oral agent  . Hypertension 1995  . Erectile dysfunction   .  Asthma   . Bipolar disorder 1965    Onset in childhood; remote history of ECT  . Palpitations     Echocardiogram normal in 07/2007 except for mild LVH; Event recorder-first degree AV block, no symptomatic spells, no significant arrhythmias; stress nuclear negative in 1996    Past Surgical History  Procedure Date  . Hernia repair 1956    , right inguinal  . Appendectomy   . Tonsillectomy 1957  . Colonoscopy w/ polypectomy 2002    Dr. Judie Petit Stark-->27mm sessile trv colon polyp, path unavailable  . Nasal septum surgery 1974  . Vasectomy 1988    ?  Subsequent reversal  . Colonoscopy 09/21/2011    RMR: Colonic polyps-treated/removed as described above/ Colonic diverticulosis. Lax sphincter time. Status post segmental biopsies(TUBULOVILLOUS ADENOMA/TUBULAR ADENOMA). Prep marginal. TCS 3 months recommended  . Esophageal biopsy 09/21/2011    Procedure: BIOPSY;  Surgeon: Corbin Ade, MD;  Location: [facility];  Service: Endoscopy;  Laterality: N/A;  WITH RANDOM BIOPSY    Family History  Problem Relation Age of Onset  . Diabetes Father   .  Heart attack Father   . Alzheimer's disease Mother   . Colon cancer Neg Hx   . Liver disease Neg Hx   . Inflammatory bowel disease Neg Hx     History   Social History  . Marital Status: Married    Spouse Name: N/A    Number of Children: 3  . Years of Education: N/A   Occupational History  . Disability     Psychiatric   Social History Main Topics  . Smoking status: Never Smoker   . Smokeless tobacco: Not on file  . Alcohol Use: No  . Drug Use: No  . Sexually Active: Not on file   Other Topics Concern  . Not on file   Social History Narrative  . No narrative on file      ROS:  General: Negative for anorexia, weight loss, fever, chills. Complains of chronic fatigue and weakness. Eyes: Negative for vision changes.  ENT: Negative for hoarseness, difficulty swallowing , nasal congestion. CV: Negative for chest pain, angina, palpitations, dyspnea on exertion, peripheral edema.  Respiratory: Negative for dyspnea at rest, dyspnea on exertion, cough, sputum, wheezing.  GI: See history of present illness. GU:  Negative for dysuria, hematuria, urinary incontinence, urinary frequency, nocturnal urination.  MS: Negative for joint pain, low back pain.  Derm: Negative for rash or itching.  Neuro: Negative for weakness, abnormal sensation, seizure, frequent headaches, memory loss, confusion.  Psych: Negative for anxiety, depression, suicidal ideation, hallucinations.  Endo: Negative for unusual weight change.  Heme: Negative for bruising or bleeding. Allergy: Negative for rash or hives.    Physical Examination:  BP 114/64  Pulse 58  Temp 98.1 F (36.7 C) (Temporal)  Ht 6\' 2"  (1.88 m)  Wt 270 lb 6.4 oz (122.653 kg)  BMI 34.72 kg/m2   General: Well-nourished, well-developed in no acute distress. Flat affect. Head: Normocephalic, atraumatic.   Eyes: Conjunctiva pink, no icterus. Mouth: Oropharyngeal mucosa moist and pink , no lesions erythema or exudate. Neck: Supple  without thyromegaly, masses, or lymphadenopathy.  Lungs: Clear to auscultation bilaterally.  Heart: Regular rate and rhythm, no murmurs rubs or gallops.  Abdomen: Bowel sounds are normal, nontender, nondistended, no hepatosplenomegaly or masses, no abdominal bruits or    hernia , no rebound or guarding.   Rectal: deferred Extremities: No lower extremity edema. No clubbing or deformities.  Neuro: Alert and oriented  x 4 , grossly normal neurologically.  Skin: Warm and dry, no rash or jaundice.   Psych: Alert and cooperative, normal mood and affect.

## 2012-01-18 NOTE — Interval H&P Note (Signed)
History and Physical Interval Note:  01/18/2012 8:20 AM  Joshua Perez  has presented today for surgery, with the diagnosis of COLON POLYPS AND CONSTIPATION  The various methods of treatment have been discussed with the patient and family. After consideration of risks, benefits and other options for treatment, the patient has consented to  Procedure(s) (LRB) with comments: COLONOSCOPY (N/A) - 8:30/GIVE PHENERGAN 25MG  IV 30 MIN PRIOR TO PROCEDURE as a surgical intervention .  The patient's history has been reviewed, patient examined, no change in status, stable for surgery.  I have reviewed the patient's chart and labs.  Questions were answered to the patient's satisfaction.     Joshua Perez  As above. No change.The risks, benefits, limitations, alternatives and imponderables have been reviewed with the patient. Potential for esophageal dilation, biopsy, etc. have also been reviewed.  Questions have been answered. All parties agreeable.

## 2012-01-22 ENCOUNTER — Encounter: Payer: Self-pay | Admitting: Internal Medicine

## 2012-01-23 ENCOUNTER — Encounter (HOSPITAL_COMMUNITY): Payer: Self-pay | Admitting: Internal Medicine

## 2012-01-23 ENCOUNTER — Encounter: Payer: Self-pay | Admitting: *Deleted

## 2012-04-01 ENCOUNTER — Encounter: Payer: Self-pay | Admitting: Cardiology

## 2012-04-01 ENCOUNTER — Ambulatory Visit (INDEPENDENT_AMBULATORY_CARE_PROVIDER_SITE_OTHER): Payer: Medicare Other | Admitting: Cardiology

## 2012-04-01 VITALS — BP 93/57 | HR 61 | Ht 74.0 in | Wt 268.0 lb

## 2012-04-01 DIAGNOSIS — I10 Essential (primary) hypertension: Secondary | ICD-10-CM

## 2012-04-01 DIAGNOSIS — R002 Palpitations: Secondary | ICD-10-CM

## 2012-04-01 DIAGNOSIS — J45909 Unspecified asthma, uncomplicated: Secondary | ICD-10-CM

## 2012-04-01 NOTE — Patient Instructions (Addendum)
Your physician recommends that you schedule a follow-up appointment in: 6 MONTHS   Your physician has recommended you make the following change in your medication:   1) STOP UNIVASC 2) TAPER ALPRAZOLAM AND VALIUM AS TOLERATED

## 2012-04-01 NOTE — Assessment & Plan Note (Signed)
Blood pressure remains on the low side with borderline significant orthostasis and symptoms. Moexipril will be discontinued. If problems with blood pressure persists, we will need to decrease his dose of metoprolol.

## 2012-04-01 NOTE — Assessment & Plan Note (Signed)
Adequately managed with beta blocker therapy at moderate dose. Recent trial with decreased medication suggests that this agent continues to be necessary at current dosage.

## 2012-04-01 NOTE — Assessment & Plan Note (Signed)
By history, patient's symptoms are most consistent with chronic asthmatic bronchitis, which does not sound as if it is terribly severe.  He does not appear to require medication on a daily basis for this problem. He has no active bronchospasm at this visit to suggest the need to modify treatment with beta blocker.

## 2012-04-01 NOTE — Progress Notes (Signed)
Patient ID: Joshua Perez, male   DOB: Oct 08, 1950, 62 y.o.   MRN: [id number]  HPI: Schedule return visit for this nice gentleman with a history of hypertension and palpitations.  Blood pressure has been low in recent months with an orthostatic component prompting a reduction in his dose of moexipril to 7.5 mg per day. Nonetheless, he continues to experience dizziness and has suffered some falls without injury. As the result of excessive sedation, a number of his psychiatric medications have been reduced in dosage or discontinued. Due to near exhaustion of his supply of metoprolol, his dose was recently decreased prompting recurrent palpitations. Once the proper dosage was restored, symptoms resolved.  Patient monitors CBGs at home noting variable values, sometimes as high as 360; however, a recent hemoglobin A1c level was reported to him as 6.9.  Current Outpatient Prescriptions  Medication Sig Dispense Refill  . alprazolam (XANAX) 2 MG tablet Take 1 tablet by mouth 2 (two) times daily.      . Ascorbic Acid (VITAMIN C) 100 MG tablet Take 100 mg by mouth daily.        . Asenapine Maleate 10 MG SUBL Place 1 tablet under the tongue daily.       Marland Kitchen aspirin 81 MG tablet Take 81 mg by mouth daily.        . Cholecalciferol (VITAMIN D-3 PO) Take 1 tablet by mouth daily.       . diazepam (VALIUM) 5 MG tablet Take 5 mg by mouth every 6 (six) hours as needed. Anxiety      . glyBURIDE (DIABETA) 5 MG tablet Take 5 mg by mouth 2 (two) times daily.       Marland Kitchen HYDROcodone-acetaminophen (NORCO/VICODIN) 5-325 MG per tablet Take 1 tablet by mouth every 8 (eight) hours as needed.       Marland Kitchen levothyroxine (SYNTHROID, LEVOTHROID) 75 MCG tablet Take 75 mcg by mouth daily.      Marland Kitchen lithium carbonate 300 MG capsule Take 600-900 mg by mouth as directed. 600 mg in the morning and 900 mg in the evening,.      . metFORMIN (GLUCOPHAGE) 500 MG tablet Take 500 mg by mouth daily with breakfast.       . metoprolol (LOPRESSOR) 50 MG tablet  Take 50 mg by mouth 2 (two) times daily.      . moexipril (UNIVASC) 15 MG tablet Take 30 mg by mouth at bedtime.        Marland Kitchen MYRBETRIQ 50 MG TB24 Take 1 tablet by mouth Daily.      . traMADol (ULTRAM) 50 MG tablet Take 50 mg by mouth every 6 (six) hours as needed. Pain      . traZODone (DESYREL) 100 MG tablet Take 200 mg by mouth at bedtime.       Marland Kitchen venlafaxine (EFFEXOR XR) 150 MG 24 hr capsule Take 150 mg by mouth 2 (two) times daily.         No current facility-administered medications for this visit.   Allergies  Allergen Reactions  . Bee Venom Anaphylaxis  . Penicillins Shortness Of Breath, Swelling and Rash    Augmentin     Past medical history, social history, and family history reviewed and updated.  ROS: Denies chest pain, dyspnea, orthopnea or syncope. He is actively followed by a psychiatrist, Dr. Betti Cruz, who manages his psychiatric medications.  All other systems reviewed and are negative.  PHYSICAL EXAM: BP 102/60  Pulse 61  Ht 6\' 2"  (1.88 m)  Wt 121.564  kg (268 lb)  BMI 34.39 kg/m2  SpO2 94%;  Body mass index is 34.39 kg/(m^2). General-Well developed; no acute distress Body habitus-moderately overweight Neck-No JVD; no carotid bruits Lungs-clear lung fields; resonant to percussion Cardiovascular-normal PMI; normal S1 and S2 Abdomen-normal bowel sounds; soft and non-tender without masses or organomegaly Musculoskeletal-No deformities, no cyanosis or clubbing Neurologic-flat affect; adequate memory; appropriately responsive; normal cranial nerves; symmetric strength and tone Skin-Warm, no significant lesions Extremities-distal pulses intact; no edema  Napoleon Bing, MD 04/01/2012  11:53 AM  ASSESSMENT AND PLAN

## 2012-04-01 NOTE — Progress Notes (Deleted)
Name: Joshua Perez    DOB: March 24, 1950  Age: 62 y.o.  MR#: [id number]       PCP:  Harlow Asa, MD      Insurance: Payor: [employer] [place] [employer]  Plan: BLUE MEDICARE  Product Type: *No Product type*    CC:   LIST LAST OV 03-15-11 ADVISED TO COME IN PRN/ADVISED INCREASE LOPRESSOR TO 50MG  BID LAST LABS ENCLOSED 10-02-11  VS Filed Vitals:   04/01/12 1115  BP: 102/60  Pulse: 61  Height: 6\' 2"  (1.88 m)  Weight: 268 lb (121.564 kg)  SpO2: 94%    Weights Current Weight  04/01/12 268 lb (121.564 kg)  01/18/12 270 lb (122.471 kg)  01/18/12 270 lb (122.471 kg)    Blood Pressure  BP Readings from Last 3 Encounters:  04/01/12 102/60  01/18/12 154/82  01/18/12 154/82     Admit date:  (Not on file) Last encounter with RMR:  Visit date not found   Allergy Bee venom and Penicillins  Current Outpatient Prescriptions  Medication Sig Dispense Refill  . alprazolam (XANAX) 2 MG tablet Take 1 tablet by mouth 2 (two) times daily.      . Ascorbic Acid (VITAMIN C) 100 MG tablet Take 100 mg by mouth daily.        . Asenapine Maleate 10 MG SUBL Place 1 tablet under the tongue daily.       Marland Kitchen aspirin 81 MG tablet Take 81 mg by mouth daily.        . Cholecalciferol (VITAMIN D-3 PO) Take 1 tablet by mouth daily.       . diazepam (VALIUM) 5 MG tablet Take 5 mg by mouth every 6 (six) hours as needed. Anxiety      . glyBURIDE (DIABETA) 5 MG tablet Take 5 mg by mouth 2 (two) times daily.       Marland Kitchen HYDROcodone-acetaminophen (NORCO/VICODIN) 5-325 MG per tablet Take 1 tablet by mouth every 8 (eight) hours as needed.       Marland Kitchen levothyroxine (SYNTHROID, LEVOTHROID) 75 MCG tablet Take 75 mcg by mouth daily.      Marland Kitchen lithium carbonate 300 MG capsule Take 600-900 mg by mouth as directed. 600 mg in the morning and 900 mg in the evening,.      . metFORMIN (GLUCOPHAGE) 500 MG tablet Take 500 mg by mouth daily with breakfast.       . metoprolol (LOPRESSOR) 50 MG tablet Take 50 mg by mouth 2 (two) times  daily.      . moexipril (UNIVASC) 15 MG tablet Take 30 mg by mouth at bedtime.        Marland Kitchen MYRBETRIQ 50 MG TB24 Take 1 tablet by mouth Daily.      . traMADol (ULTRAM) 50 MG tablet Take 50 mg by mouth every 6 (six) hours as needed. Pain      . traZODone (DESYREL) 100 MG tablet Take 200 mg by mouth at bedtime.       Marland Kitchen venlafaxine (EFFEXOR XR) 150 MG 24 hr capsule Take 150 mg by mouth 2 (two) times daily.         No current facility-administered medications for this visit.    Discontinued Meds:    Medications Discontinued During This Encounter  Medication Reason  . multivitamin (THERAGRAN) per tablet Error  . polyethylene glycol-electrolytes (TRILYTE) 420 G solution Error    Patient Active Problem List  Diagnosis  . Hypertension  . Diabetes mellitus, type 2  . Erectile  dysfunction  . Depression  . Asthma  . Palpitations  . Hx of colonic polyps  . Constipation    LABS @BMET3 @  @CMPRESULT3 @ @CBC3 @  Lipid Panel  No results found for this basename: chol, trig, hdl, cholhdl, vldl, ldlcalc    ABG No results found for this basename: phart, pco2, pco2art, po2, po2art, hco3, tco2, acidbasedef, o2sat     BNP (last 3 results) No results found for this basename: PROBNP,  in the last 8760 hours Cardiac Panel (last 3 results) No results found for this basename: CKTOTAL, CKMB, TROPONINI, RELINDX,  in the last 72 hours  Iron/TIBC/Ferritin    Component Value Date/Time   IRON 75 11/06/2011 0828   TIBC 351 11/06/2011 0828     EKG Orders placed during the hospital encounter of 11/22/10  . HOLTER MONITOR - 48 HOUR  . HOLTER MONITOR - 48 HOUR  . HOLTER MONITOR - 48 HOUR  . HOLTER MONITOR - 48 HOUR     Prior Assessment and Plan Problem List as of 04/01/2012     ICD-9-CM   Hypertension   Last Assessment & Plan   03/15/2011 Office Visit Written 03/16/2011  7:01 PM by Kathlen Brunswick, MD     Blood pressure is adequately controlled with current medication, which will be continued  Low-dose beta blocker and ACE inhibitor or his only antihypertensive medications.    Diabetes mellitus, type 2   Erectile dysfunction   Depression   Last Assessment & Plan   12/04/2010 Office Visit Edited 12/06/2010  7:09 PM by Kathlen Brunswick, MD     Beta blocker could also negatively affect his mood, but this occurs infrequently and is typically relatively minor.  Again, a cautious trial of low-dose metoprolol appears reasonable.    Asthma   Last Assessment & Plan   12/04/2010 Office Visit Written 12/04/2010  2:03 PM by Kathlen Brunswick, MD     Although asthma is noted among the patient's past medical issues, he denies a history of any lung disorders.  He has not experienced wheezing nor dyspnea at rest.  A trial of beta blocker is reasonable, but we will proceed cautiously.    Palpitations   Last Assessment & Plan   03/15/2011 Office Visit Written 03/16/2011  7:02 PM by Kathlen Brunswick, MD     Symptoms are reasonably well controlled.  Further improvement will be sought by increasing metoprolol to 50 mg twice a day.  As long as Mr. Vollmer continues to do well, he does not require routine cardiology followup.  I will happily see him again at any time with Dr. Gerda Diss so requests.    Hx of colonic polyps   Last Assessment & Plan   12/26/2011 Office Visit Edited 12/26/2011  2:56 PM by Tiffany Kocher, PA     Due for short interval surveillance colonoscopy given multiple colonic polyps, tubulovillous adenomas, poor bowel prep. He will have an extra half a day of clear liquids. He will also get an extra 2 L of GoLYTELY, for a total of 6 L. He will also receive Dulcolax 10 mg orally daily 2 days leading into his bowel prep including what comes with the bowel prep. He will begin MiraLax 17 g daily now. As before he will receive Phenergan 25 mg IV 30 minutes before the procedure to augment conscious sedation in light of his polypharmacy. Regarding his diabetic medications he will take half dose of  glyburide and metformin in the morning of the bowel  prep and none in the evening prior to procedure.  I have discussed the risks, alternatives, benefits with regards to but not limited to the risk of reaction to medication, bleeding, infection, perforation and the patient is agreeable to proceed. Written consent to be obtained.     Constipation       Imaging: No results found.

## 2012-04-03 ENCOUNTER — Encounter: Payer: Self-pay | Admitting: Cardiology

## 2012-04-03 DIAGNOSIS — R945 Abnormal results of liver function studies: Secondary | ICD-10-CM | POA: Insufficient documentation

## 2012-04-03 DIAGNOSIS — R7989 Other specified abnormal findings of blood chemistry: Secondary | ICD-10-CM | POA: Insufficient documentation

## 2012-04-07 ENCOUNTER — Other Ambulatory Visit: Payer: Self-pay | Admitting: Cardiology

## 2012-05-22 ENCOUNTER — Other Ambulatory Visit: Payer: Self-pay | Admitting: Gastroenterology

## 2012-05-22 ENCOUNTER — Other Ambulatory Visit: Payer: Self-pay

## 2012-05-22 ENCOUNTER — Telehealth: Payer: Self-pay | Admitting: Internal Medicine

## 2012-05-22 DIAGNOSIS — R7989 Other specified abnormal findings of blood chemistry: Secondary | ICD-10-CM

## 2012-05-22 DIAGNOSIS — R945 Abnormal results of liver function studies: Secondary | ICD-10-CM

## 2012-05-22 NOTE — Telephone Encounter (Signed)
LFTs in 6 months.  

## 2012-05-22 NOTE — Telephone Encounter (Signed)
Lab order in mail to pt.

## 2012-06-12 LAB — HEPATIC FUNCTION PANEL
ALT: 133 U/L — ABNORMAL HIGH (ref 0–53)
Bilirubin, Direct: 0.1 mg/dL (ref 0.0–0.3)
Total Bilirubin: 0.6 mg/dL (ref 0.3–1.2)

## 2012-06-18 NOTE — Progress Notes (Signed)
Quick Note:  AST/ALT continue to increase. Patient needs OV nonurgent. ______

## 2012-06-26 ENCOUNTER — Ambulatory Visit (INDEPENDENT_AMBULATORY_CARE_PROVIDER_SITE_OTHER): Payer: Medicare Other | Admitting: Gastroenterology

## 2012-06-26 ENCOUNTER — Encounter: Payer: Self-pay | Admitting: Gastroenterology

## 2012-06-26 VITALS — BP 131/74 | HR 60 | Temp 98.4°F | Ht 74.0 in | Wt 262.2 lb

## 2012-06-26 DIAGNOSIS — R195 Other fecal abnormalities: Secondary | ICD-10-CM

## 2012-06-26 DIAGNOSIS — R7401 Elevation of levels of liver transaminase levels: Secondary | ICD-10-CM

## 2012-06-26 NOTE — Progress Notes (Signed)
Referring Provider: Merlyn Albert, MD Primary Care Physician:  Harlow Asa, MD Primary GI: Dr. Jena Gauss   Chief Complaint  Patient presents with  . Advice Only    HPI:   Mr. Deutschman is a 62 year old male presenting today in follow-up secondary to elevated transaminases. First elevation noted in July 2013, with AST/ALT 48 and 76 respectively ; prior LFTs normal. Continued slow trend upwards, with most recent AST/ALT 110 and 133 respectively. Otherwise, LFTs remain normal. [place] of abdomen on file from Aug 2013 with fatty liver. Viral markers negative, iron not elevated. He has a history of chronic diarrhea, with negative celiac serologies.  No abdominal pain, N/V. +wt loss, chronic diarrhea. Doesn't eat as much as he used to. Has diabetes, trying to eat healthier. Good appetite. Not snacking. No dysphagia. Daily loose stools, up to 5 per day. Not necessarily related to eating. No rectal bleeding. No NSAIDs, aspirin powders. ?oily stool, floats. Diarrhea about same as always been.   WEIGHT Oct 2012: 282 August 2013: 276  Nov 2013 270 May 2014 262   Past Medical History  Diagnosis Date  . Diabetes mellitus, type 2 1980    Recent hemoglobin A1c reportedly 5.3; controlled with single oral agent  . Hypertension 1995  . Erectile dysfunction   . Asthma   . Bipolar disorder 1965    Onset in childhood; remote history of ECT  . Palpitations     Echocardiogram normal in 07/2007 except for mild LVH; Event recorder-first degree AV block, no symptomatic spells, no significant arrhythmias; stress nuclear negative in 1996    Past Surgical History  Procedure Laterality Date  . Hernia repair  1956    , right inguinal  . Appendectomy    . Tonsillectomy  1957  . Colonoscopy w/ polypectomy  2002    Dr. Judie Petit Stark-->42mm sessile trv colon polyp, path unavailable  . Nasal septum surgery  1974  . Vasectomy  1988    ?  Subsequent reversal  . Colonoscopy  09/21/2011    RMR: Colonic  polyps-treated/removed as described above/ Colonic diverticulosis. Lax sphincter time. Status post segmental biopsies(TUBULOVILLOUS ADENOMA/TUBULAR ADENOMA). Prep marginal. TCS 3 months recommended  . Esophageal biopsy  09/21/2011    NOT DONE. Unable to remove from [facility] list  . Colonoscopy  01/18/2012    RMR: tubular adenoma. Surveillance due Dec 2016    Current Outpatient Prescriptions  Medication Sig Dispense Refill  . alprazolam (XANAX) 2 MG tablet Take 1 tablet by mouth 2 (two) times daily.      . Ascorbic Acid (VITAMIN C) 100 MG tablet Take 100 mg by mouth daily.        . Asenapine Maleate 10 MG SUBL Place 1 tablet under the tongue daily.       Marland Kitchen aspirin 81 MG tablet Take 81 mg by mouth daily.        . Cholecalciferol (VITAMIN D-3 PO) Take 1 tablet by mouth daily.       Marland Kitchen desmopressin (DDAVP) 0.2 MG tablet Take 0.2 mg by mouth 2 (two) times daily.       . diazepam (VALIUM) 5 MG tablet Take 5 mg by mouth every 6 (six) hours as needed. Anxiety      . glyBURIDE (DIABETA) 5 MG tablet Take 5 mg by mouth 2 (two) times daily.       Marland Kitchen HYDROcodone-acetaminophen (NORCO/VICODIN) 5-325 MG per tablet Take 1 tablet by mouth every 8 (eight) hours as needed.       Marland Kitchen  levothyroxine (SYNTHROID, LEVOTHROID) 75 MCG tablet Take 75 mcg by mouth daily.      Marland Kitchen lithium carbonate 300 MG capsule Take 600-900 mg by mouth as directed. 600 mg in the morning and 900 mg in the evening,.      . metFORMIN (GLUCOPHAGE) 500 MG tablet Take 500 mg by mouth daily with breakfast.       . metoprolol (LOPRESSOR) 50 MG tablet TAKE ONE TABLET BY MOUTH TWICE DAILY  180 tablet  0  . moexipril (UNIVASC) 15 MG tablet Take 30 mg by mouth at bedtime.        . traZODone (DESYREL) 100 MG tablet Take 200 mg by mouth at bedtime.       Marland Kitchen venlafaxine (EFFEXOR XR) 150 MG 24 hr capsule Take 150 mg by mouth 2 (two) times daily.         No current facility-administered medications for this visit.    Allergies as of 06/26/2012 - Review Complete  06/26/2012  Allergen Reaction Noted  . Bee venom Anaphylaxis 08/27/2011  . Penicillins Shortness Of Breath, Swelling, and Rash 11/20/2010    Family History  Problem Relation Age of Onset  . Diabetes Father   . Heart attack Father   . Alzheimer's disease Mother   . Colon cancer Neg Hx   . Liver disease Neg Hx   . Inflammatory bowel disease Neg Hx     History   Social History  . Marital Status: Married    Spouse Name: N/A    Number of Children: 3  . Years of Education: N/A   Occupational History  . Disability     Psychiatric   Social History Main Topics  . Smoking status: Never Smoker   . Smokeless tobacco: None  . Alcohol Use: No  . Drug Use: No  . Sexually Active: None   Other Topics Concern  . None   Social History Narrative  . None    Review of Systems: Negative unless mentioned in HPI  Physical Exam: BP 131/74  Pulse 60  Temp(Src) 98.4 F (36.9 C) (Oral)  Ht 6\' 2"  (1.88 m)  Wt 262 lb 3.2 oz (118.933 kg)  BMI 33.65 kg/m2 General:   Alert and oriented. No distress noted. Pleasant and cooperative.  Head:  Normocephalic and atraumatic. Eyes:  Conjuctiva clear without scleral icterus. Heart:  S1, S2 present without murmurs, rubs, or gallops. Regular rate and rhythm. Abdomen:  +BS, soft, non-tender and non-distended. No rebound or guarding. No HSM or masses noted. Msk:  Symmetrical without gross deformities. Normal posture. Extremities:  Without edema. Neurologic:  Alert and  oriented x4;  grossly normal neurologically. Skin:  Intact without significant lesions or rashes. Psych:  Alert and cooperative. Normal mood and affect.

## 2012-06-26 NOTE — Patient Instructions (Addendum)
Please complete blood work and stool sample  We will be in touch with the results as soon as they are available.  Further recommendations to follow.  Great job on the weight loss!

## 2012-06-30 ENCOUNTER — Encounter: Payer: Self-pay | Admitting: Gastroenterology

## 2012-06-30 DIAGNOSIS — R7401 Elevation of levels of liver transaminase levels: Secondary | ICD-10-CM | POA: Insufficient documentation

## 2012-06-30 DIAGNOSIS — R195 Other fecal abnormalities: Secondary | ICD-10-CM | POA: Insufficient documentation

## 2012-06-30 NOTE — Assessment & Plan Note (Signed)
Chronic, with negative colonoscopy Dec 2013 other than tubular adenoma. Due to poor prep, repeat again in Dec 2016. Steady, slow weight loss concerning. Appears there may be some component of "oily" stools. Check fecal fat, consider CT abd/pelvis if continued weight loss.

## 2012-06-30 NOTE — Progress Notes (Signed)
Forwarded to PCP.

## 2012-06-30 NOTE — Assessment & Plan Note (Signed)
62 year old male with steady increase in AST/ALT since July 2013, other LFTs remaining normal. Fatty liver noted on [place] of abdomen, with viral markers negative and iron level normal. Asymptomatic. Needs further work-up as outlined in labs.

## 2012-07-02 LAB — IGG, IGA, IGM
IgA: 420 mg/dL — ABNORMAL HIGH (ref 68–379)
IgG (Immunoglobin G), Serum: 1160 mg/dL (ref 650–1600)

## 2012-07-02 LAB — CERULOPLASMIN: Ceruloplasmin: 31 mg/dL (ref 20–60)

## 2012-07-02 LAB — ANA: Anti Nuclear Antibody(ANA): NEGATIVE

## 2012-07-09 NOTE — Progress Notes (Signed)
Quick Note:  Work-up for liver is negative thus far to include ceruloplasmin, ANA, ASMA, AMA, viral markers, iron studies.  I looked through his medications, and glyburide is known to cause AST/ALT elevations, hepatitis. He is not on a statin that I am aware of.  It looks like he has been on glyburide for awhile. Does he have an endocrinologist?   His fecal fat is normal. What is his weight now? I say we pursue a CT scan if he has lost more weight. Reason: evaluate for occult malignancy.  Chronic diarrhea with prior extensive work-up. Would recommend Imodium once daily in the morning. May have diabetic enteropathy.  Return to see me in 4 weeks. Will need to recheck HFP at that time. ______

## 2012-07-10 ENCOUNTER — Other Ambulatory Visit: Payer: Self-pay | Admitting: Gastroenterology

## 2012-07-10 ENCOUNTER — Other Ambulatory Visit: Payer: Self-pay

## 2012-07-10 ENCOUNTER — Encounter: Payer: Self-pay | Admitting: Gastroenterology

## 2012-07-10 DIAGNOSIS — R7401 Elevation of levels of liver transaminase levels: Secondary | ICD-10-CM

## 2012-07-10 NOTE — Progress Notes (Signed)
Quick Note:  Pt is aware, he said he is trying to loose weight and doesn't feel like he needs to have a ct done right now.  Darl Pikes, please schedule ov with AS in 4 weeks. Lab order will be mailed to pt. ______

## 2012-07-12 ENCOUNTER — Encounter: Payer: Self-pay | Admitting: *Deleted

## 2012-07-22 ENCOUNTER — Ambulatory Visit (INDEPENDENT_AMBULATORY_CARE_PROVIDER_SITE_OTHER): Payer: Medicare Other | Admitting: Family Medicine

## 2012-07-22 ENCOUNTER — Encounter: Payer: Self-pay | Admitting: Family Medicine

## 2012-07-22 VITALS — BP 130/80 | HR 70 | Ht 73.25 in | Wt 258.2 lb

## 2012-07-22 DIAGNOSIS — E119 Type 2 diabetes mellitus without complications: Secondary | ICD-10-CM

## 2012-07-22 DIAGNOSIS — I10 Essential (primary) hypertension: Secondary | ICD-10-CM

## 2012-07-22 DIAGNOSIS — Z79899 Other long term (current) drug therapy: Secondary | ICD-10-CM

## 2012-07-22 LAB — HEPATIC FUNCTION PANEL
ALT: 84 U/L — ABNORMAL HIGH (ref 0–53)
AST: 52 U/L — ABNORMAL HIGH (ref 0–37)
Albumin: 4.7 g/dL (ref 3.5–5.2)
Alkaline Phosphatase: 104 U/L (ref 39–117)
Bilirubin, Direct: 0.1 mg/dL (ref 0.0–0.3)
Indirect Bilirubin: 0.5 mg/dL (ref 0.0–0.9)
Total Bilirubin: 0.6 mg/dL (ref 0.3–1.2)
Total Protein: 7.8 g/dL (ref 6.0–8.3)

## 2012-07-22 LAB — LIPID PANEL
Cholesterol: 144 mg/dL (ref 0–200)
HDL: 31 mg/dL — ABNORMAL LOW
LDL Cholesterol: 59 mg/dL (ref 0–99)
Total CHOL/HDL Ratio: 4.6 ratio
Triglycerides: 268 mg/dL — ABNORMAL HIGH
VLDL: 54 mg/dL — ABNORMAL HIGH (ref 0–40)

## 2012-07-22 LAB — POCT GLYCOSYLATED HEMOGLOBIN (HGB A1C): Hemoglobin A1C: 7.4

## 2012-07-22 MED ORDER — GLYBURIDE 5 MG PO TABS: ORAL_TABLET | ORAL | Status: DC

## 2012-07-22 NOTE — Progress Notes (Signed)
  Subjective:    Patient ID: Joshua Perez, male    DOB: 28-Nov-1950, 62 y.o.   MRN: [id number]  Diabetes He has type 2 diabetes mellitus. His disease course has been worsening. Hypoglycemia symptoms include nervousness/anxiousness. Pertinent negatives for hypoglycemia include no confusion. Associated symptoms include foot ulcerations. Pertinent negatives for diabetes include no blurred vision and no chest pain. There are no hypoglycemic complications. Symptoms are worsening. There are no diabetic complications. There are no known risk factors for coronary artery disease. Current diabetic treatment includes diet and oral agent (dual therapy). He is compliant with treatment most of the time. His weight is increasing steadily. He is following a generally healthy diet. Meal planning includes avoidance of concentrated sweets. He has not had a previous visit with a dietician. He participates in exercise daily. His breakfast blood glucose is taken between 7-8 am. His breakfast blood glucose range is generally 140-180 mg/dl. An ACE inhibitor/angiotensin II receptor blocker is not being taken.   Patient reports compliant with blood pressure medicine. No headache or chest pain. Not exercising much.  Patient has history of asthma. Clinically stable at this time. None significant. Claims compliance with lipid diet history of elevated triglycerides. History of elevated liver enzymes. Review of Systems  Eyes: Negative for blurred vision.  Cardiovascular: Negative for chest pain.  Psychiatric/Behavioral: Negative for confusion. The patient is nervous/anxious.        Objective:   Physical Exam Alert no acute distress. HEENT normal. Lungs clear. Heart regular rate and rhythm.  Results for orders placed in visit on 07/22/12  POCT GLYCOSYLATED HEMOGLOBIN (HGB A1C)      Result Value Range   Hemoglobin A1C 7.4     C. feet exam.     Assessment & Plan:  Impression 1 type 2 diabetes suboptimum control. #2  hypertension good control. #3 hyperlipidemia status uncertain. #4 elevated liver enzymes status uncertain. Plan increase DiaBeta to 2 tablets twice a day appropriate blood work. Diet exercise discussed in encourage. Recheck in several months if A1c not improved will try other interventions. WSL

## 2012-07-23 LAB — MICROALBUMIN, URINE: Microalb, Ur: 21.03 mg/dL — ABNORMAL HIGH (ref 0.00–1.89)

## 2012-08-07 ENCOUNTER — Ambulatory Visit: Payer: Medicare Other | Admitting: Gastroenterology

## 2012-08-07 ENCOUNTER — Encounter: Payer: Self-pay | Admitting: Family Medicine

## 2012-09-15 ENCOUNTER — Telehealth: Payer: Self-pay | Admitting: Family Medicine

## 2012-09-15 NOTE — Telephone Encounter (Signed)
Error

## 2012-10-23 ENCOUNTER — Ambulatory Visit: Payer: Medicare Other | Admitting: Family Medicine

## 2012-10-24 ENCOUNTER — Other Ambulatory Visit: Payer: Self-pay | Admitting: *Deleted

## 2012-10-24 ENCOUNTER — Telehealth: Payer: Self-pay | Admitting: Family Medicine

## 2012-10-24 MED ORDER — METFORMIN HCL 500 MG PO TABS: ORAL_TABLET | ORAL | Status: DC

## 2012-10-24 NOTE — Telephone Encounter (Signed)
Patients wife is calling to speak with a nurse about her husbands blood sugar because it has been running high and she is afraid it is too high.

## 2012-10-24 NOTE — Telephone Encounter (Signed)
Blood sugar has been in the high 300's and a few in the 400's over the past two weeks. Today FBS was 330. He takes glyburide 5mg  2 BID and metformin 500 takes 2 at [facility]. Wife states she gives both pills at night bc bottle says take 2 a day. He has a follow up on Sept 22nd. Wife leaves at 4pm would like a call back before 4pm if possible.

## 2012-10-24 NOTE — Telephone Encounter (Signed)
Increase metformin to two twice per d, adjust rx accordingly

## 2012-10-24 NOTE — Telephone Encounter (Signed)
Discussed with patient's wife med sent to [facility]

## 2012-11-03 ENCOUNTER — Encounter: Payer: Self-pay | Admitting: Family Medicine

## 2012-11-03 ENCOUNTER — Ambulatory Visit (INDEPENDENT_AMBULATORY_CARE_PROVIDER_SITE_OTHER): Payer: Medicare Other | Admitting: Family Medicine

## 2012-11-03 ENCOUNTER — Ambulatory Visit (INDEPENDENT_AMBULATORY_CARE_PROVIDER_SITE_OTHER): Payer: Medicare Other | Admitting: Cardiology

## 2012-11-03 VITALS — BP 136/84 | Ht 73.0 in | Wt 262.4 lb

## 2012-11-03 VITALS — BP 118/68 | HR 77 | Ht 73.25 in | Wt 260.8 lb

## 2012-11-03 DIAGNOSIS — I10 Essential (primary) hypertension: Secondary | ICD-10-CM

## 2012-11-03 DIAGNOSIS — R002 Palpitations: Secondary | ICD-10-CM

## 2012-11-03 DIAGNOSIS — J45909 Unspecified asthma, uncomplicated: Secondary | ICD-10-CM

## 2012-11-03 DIAGNOSIS — E119 Type 2 diabetes mellitus without complications: Secondary | ICD-10-CM

## 2012-11-03 DIAGNOSIS — E785 Hyperlipidemia, unspecified: Secondary | ICD-10-CM

## 2012-11-03 DIAGNOSIS — Z23 Encounter for immunization: Secondary | ICD-10-CM

## 2012-11-03 MED ORDER — EPINEPHRINE 0.3 MG/0.3ML IJ SOAJ: 0.3000 mg | Freq: Once | INTRAMUSCULAR | Status: DC

## 2012-11-03 MED ORDER — ALBUTEROL SULFATE HFA 108 (90 BASE) MCG/ACT IN AERS: 2.0000 | INHALATION_SPRAY | Freq: Four times a day (QID) | RESPIRATORY_TRACT | Status: DC | PRN

## 2012-11-03 MED ORDER — METOPROLOL TARTRATE 50 MG PO TABS: ORAL_TABLET | ORAL | Status: DC

## 2012-11-03 MED ORDER — CANAGLIFLOZIN 100 MG PO TABS: 100.0000 mg | ORAL_TABLET | Freq: Every morning | ORAL | Status: DC

## 2012-11-03 NOTE — Progress Notes (Signed)
Clinical Summary Joshua Perez is a 62 y.o.male 1. Palpitations - suppressed w/ metoprolol, symptoms previously reoccured after a prior decrease in medication. - reports symptoms 3-4 times a day, just a few seconds. No other associated symptoms. This stable frequency is stable. Limiting caffeine intake.   2. HTN - recent low bps w/ some orthostasis - moexipril was stopped last visit back in 03/2012 - checks at home 2-3 times a week, typically 120s/60s. Reports some occasional orthostatic sympstom   Past Medical History  Diagnosis Date  . Diabetes mellitus, type 2 1980    Recent hemoglobin A1c reportedly 5.3; controlled with single oral agent  . Hypertension 1995  . Erectile dysfunction   . Asthma   . Bipolar disorder 1965    Onset in childhood; remote history of ECT  . Palpitations     Echocardiogram normal in 07/2007 except for mild LVH; Event recorder-first degree AV block, no symptomatic spells, no significant arrhythmias; stress nuclear negative in 1996     Allergies  Allergen Reactions  . Bee Venom Anaphylaxis  . Penicillins Shortness Of Breath, Swelling and Rash    Augmentin     Current Outpatient Prescriptions  Medication Sig Dispense Refill  . alprazolam (XANAX) 2 MG tablet Take 1 tablet by mouth 2 (two) times daily.      . Ascorbic Acid (VITAMIN C) 100 MG tablet Take 100 mg by mouth daily.        . Asenapine Maleate 10 MG SUBL Place 1 tablet under the tongue daily.       Marland Kitchen aspirin 81 MG tablet Take 81 mg by mouth daily.        . Cholecalciferol (VITAMIN D-3 PO) Take 1 tablet by mouth daily.       Marland Kitchen desmopressin (DDAVP) 0.2 MG tablet Take 0.2 mg by mouth 2 (two) times daily.       . diazepam (VALIUM) 5 MG tablet Take 5 mg by mouth every 6 (six) hours as needed. Anxiety      . glyBURIDE (DIABETA) 5 MG tablet Take 2 tablets twice daily  120 tablet  11  . HYDROcodone-acetaminophen (NORCO/VICODIN) 5-325 MG per tablet Take 1 tablet by mouth every 8 (eight) hours as  needed.       Marland Kitchen levothyroxine (SYNTHROID, LEVOTHROID) 75 MCG tablet Take 75 mcg by mouth daily.      Marland Kitchen lithium carbonate 300 MG capsule Take 600-900 mg by mouth as directed. 600 mg in the morning and 900 mg in the evening,.      . metFORMIN (GLUCOPHAGE) 500 MG tablet Take two tablets twice daily  120 tablet  0  . metoprolol (LOPRESSOR) 50 MG tablet TAKE ONE TABLET BY MOUTH TWICE DAILY  180 tablet  0  . traZODone (DESYREL) 100 MG tablet Take 200 mg by mouth at bedtime.       Marland Kitchen venlafaxine (EFFEXOR XR) 150 MG 24 hr capsule Take 150 mg by mouth daily. Take 2 tablets in morning and 2 tablets at bedtime       No current facility-administered medications for this visit.     Past Surgical History  Procedure Laterality Date  . Hernia repair  1956    , right inguinal  . Appendectomy    . Tonsillectomy  1957  . Colonoscopy w/ polypectomy  2002    Dr. Judie Petit Stark-->87mm sessile trv colon polyp, path unavailable  . Nasal septum surgery  1974  . Vasectomy  1988    ?  Subsequent reversal  .  Colonoscopy  09/21/2011    RMR: Colonic polyps-treated/removed as described above/ Colonic diverticulosis. Lax sphincter time. Status post segmental biopsies(TUBULOVILLOUS ADENOMA/TUBULAR ADENOMA). Prep marginal. TCS 3 months recommended  . Esophageal biopsy  09/21/2011    NOT DONE. Unable to remove from [facility] list  . Colonoscopy  01/18/2012    RMR: tubular adenoma. Surveillance due Dec 2016     Allergies  Allergen Reactions  . Bee Venom Anaphylaxis  . Penicillins Shortness Of Breath, Swelling and Rash    Augmentin      Family History  Problem Relation Age of Onset  . Diabetes Father   . Heart attack Father   . Alzheimer's disease Mother   . Colon cancer Neg Hx   . Liver disease Neg Hx   . Inflammatory bowel disease Neg Hx      Social History Joshua Perez reports that he has never smoked. He does not have any smokeless tobacco history on file. Joshua Perez reports that he does not drink  alcohol.   Review of Systems 12 point ROS negative other than reported in HPI  Physical Examination  Gen: resting comfortably, NAD HEENT: no scleral icterus, pupils equal round and reactive, no palptable cervical adenopathy CV: RRR, no m/r/g, no JVD, no carotid bruits Pulm: CTAB Abd: soft, NT, ND NABS, no hepatosplenomegaly Ext: warm, no edema.  Skin: warm, no rash Neuro: A&Ox3, no focal deficits    Diagnostic Studies 11/2010 Holter: sinus and sinus brady, short 8 beat run of SVT.  07/2012: TG 268 HDL 31 LDL 59  11/03/12 EKG: sinus brady, 1st degree block, normal axis, old non-spec ST/T changes.   Assessment and Plan  1. Palpitations: minimal symptoms, will continue current therapy. Further titration of his beta blocker likely would worsen his orthostatic symptoms, which currently are just occasional and now tolerable since his ACE-I was stopped last visit  2. HTN: at goal, continue current meds  3. HL: followed by pcp, pt w/ elevated TG, low HDL, normal LDL. Counseled on diet and exercise, strict diabetes control. Can consider OTC fish oil.    Antoine Poche, M.D., F.A.C.C.

## 2012-11-03 NOTE — Patient Instructions (Addendum)
Your physician recommends that you schedule a follow-up appointment in: 1 year You will receive a reminder letter two months in advance reminding you to call and schedule your appointment. If you don't receive this letter, please contact our office.  Your physician recommends that you continue on your current medications as directed. Please refer to the Current Medication list given to you today.   

## 2012-11-03 NOTE — Progress Notes (Signed)
  Subjective:    Patient ID: Joshua Perez, male    DOB: 15-Oct-1950, 62 y.o.   MRN: [id number]  Diabetes He presents for his follow-up diabetic visit. He has type 2 diabetes mellitus. His disease course has been worsening. Pertinent negatives for diabetes include no blurred vision and no chest pain. Pertinent negatives for hypoglycemia complications include no blackouts and no hospitalization. Symptoms are worsening. Risk factors for coronary artery disease include dyslipidemia, family history, hypertension and male sex. When asked about current treatments, none were reported. He is following a diabetic and generally healthy diet. Meal planning includes avoidance of concentrated sweets. He participates in exercise every other day. His breakfast blood glucose is taken between 9-10 am. His breakfast blood glucose range is generally >200 mg/dl. An ACE inhibitor/angiotensin II receptor blocker is being taken.    Patient arrives for a follow up on his diabetes. Results for orders placed in visit on 07/22/12  LIPID PANEL      Result Value Range   Cholesterol 144  0 - 200 mg/dL   Triglycerides 086 (*) <150 mg/dL   HDL 31 (*) >57 mg/dL   Total CHOL/HDL Ratio 4.6     VLDL 54 (*) 0 - 40 mg/dL   LDL Cholesterol 59  0 - 99 mg/dL  HEPATIC FUNCTION PANEL      Result Value Range   Total Bilirubin 0.6  0.3 - 1.2 mg/dL   Bilirubin, Direct 0.1  0.0 - 0.3 mg/dL   Indirect Bilirubin 0.5  0.0 - 0.9 mg/dL   Alkaline Phosphatase 104  39 - 117 U/L   AST 52 (*) 0 - 37 U/L   ALT 84 (*) 0 - 53 U/L   Total Protein 7.8  6.0 - 8.3 g/dL   Albumin 4.7  3.5 - 5.2 g/dL  MICROALBUMIN, URINE      Result Value Range   Microalb, Ur 21.03 (*) 0.00 - 1.89 mg/dL  POCT GLYCOSYLATED HEMOGLOBIN (HGB A1C)      Result Value Range   Hemoglobin A1C 7.4     Results for orders placed in visit on 11/03/12  POCT GLYCOSYLATED HEMOGLOBIN (HGB A1C)      Result Value Range   Hemoglobin A1C 7.9     patient claims compliance with  blood pressure medicine. Trying to watch salt intake. Walking some but not a lot.  No major difficulty with asthma. Uses inhaler just sporadically. Overall breathing doing quite good this fall  Comply with lipid medicine no obvious side effects.  Just saw a cardiologist. They recommended no new medications.  Continues to see psychiatrist regularly for his health concerns.  Review of Systems  Eyes: Negative for blurred vision.  Cardiovascular: Negative for chest pain.   ROS otherwise negative    Objective:   Physical Exam  Alert hydration good HEENT normal. Lungs clear heart regular in rhythm. Feet exam not done today. Ankles without edema.      Assessment & Plan:  Impression #1 hypertension good control. #2 hyperlipidemia control decent discussed. #3 type 2 diabetes unsatisfactory control morning numbers quite high A1c actually better than predicted. #4 asthma clinically stable plan add info, 100 mg daily rationale discussed. Diet exercise discussed in encourage. Flu shot today. 25 minutes spent most in discussion recheck in several months. WSL

## 2012-11-04 ENCOUNTER — Telehealth: Payer: Self-pay | Admitting: Family Medicine

## 2012-11-04 MED ORDER — METFORMIN HCL 500 MG PO TABS: ORAL_TABLET | ORAL | Status: DC

## 2012-11-04 NOTE — Telephone Encounter (Signed)
Yes, we added invokana 100 mg daily to his other meds

## 2012-11-04 NOTE — Telephone Encounter (Signed)
Patient was seen on 11/03/2012 by Dr. Brett Canales.  Patient would like to know if he is to continue taking metFORMIN (GLUCOPHAGE) 500 MG tablet and glyBURIDE (DIABETA) 5 MG tablet along with the new Insulin that is going to be phoned in for the patient.    Also needs a new prescription phoned into [facility] in [place] for metFORMIN (GLUCOPHAGE) 500 MG tablet for 4 times a day.  Please call patient or his wife to discuss further.  Wife states Husband gets confused as to what he is told at the Dr. Astronomer.  Please call Patient. Thanks

## 2012-11-04 NOTE — Telephone Encounter (Signed)
Notified wife yes, we added invokana 100 mg daily to his other meds. Refills on metformin sent to pharmacy.

## 2012-11-07 ENCOUNTER — Ambulatory Visit: Payer: Medicare Other | Admitting: Family Medicine

## 2012-11-24 ENCOUNTER — Telehealth: Payer: Self-pay | Admitting: Family Medicine

## 2012-11-24 MED ORDER — CANAGLIFLOZIN 100 MG PO TABS: 100.0000 mg | ORAL_TABLET | Freq: Every morning | ORAL | Status: DC

## 2012-11-24 NOTE — Telephone Encounter (Signed)
Notified wife that Joshua Perez was sent to [facility] and script for test strips and lancets was faxed to [facility].

## 2012-11-24 NOTE — Telephone Encounter (Signed)
States Canagliflozin (INVOKANA) 100 MG TABS that was given to him on 11/03/2012 by Dr. Brett Canales is working fine.  However, the medication has no refills.  Patient would like a 2 refills to last until his 3 month follow up.  [facility] in [place] for this medication  Also, Patient states he would like to start testing his blood sugar 3 times a day instead of the 2 times a day.  States he needs a new prescription for test strips as well as lancets.  Phoned into [facility] [phone]  Call Patient to advise if we are able to fulfill his request.

## 2013-01-22 ENCOUNTER — Ambulatory Visit (INDEPENDENT_AMBULATORY_CARE_PROVIDER_SITE_OTHER): Payer: Medicare Other | Admitting: Family Medicine

## 2013-01-22 ENCOUNTER — Encounter: Payer: Self-pay | Admitting: Family Medicine

## 2013-01-22 VITALS — BP 130/88 | Ht 73.0 in | Wt 236.5 lb

## 2013-01-22 DIAGNOSIS — E785 Hyperlipidemia, unspecified: Secondary | ICD-10-CM

## 2013-01-22 DIAGNOSIS — Z79899 Other long term (current) drug therapy: Secondary | ICD-10-CM

## 2013-01-22 DIAGNOSIS — Z23 Encounter for immunization: Secondary | ICD-10-CM

## 2013-01-22 DIAGNOSIS — Z125 Encounter for screening for malignant neoplasm of prostate: Secondary | ICD-10-CM

## 2013-01-22 DIAGNOSIS — E782 Mixed hyperlipidemia: Secondary | ICD-10-CM

## 2013-01-22 DIAGNOSIS — I10 Essential (primary) hypertension: Secondary | ICD-10-CM

## 2013-01-22 DIAGNOSIS — E119 Type 2 diabetes mellitus without complications: Secondary | ICD-10-CM

## 2013-01-22 DIAGNOSIS — E039 Hypothyroidism, unspecified: Secondary | ICD-10-CM

## 2013-01-22 LAB — LIPID PANEL
Cholesterol: 143 mg/dL (ref 0–200)
HDL: 31 mg/dL — ABNORMAL LOW (ref >39)
Total CHOL/HDL Ratio: 4.6 Ratio
Triglycerides: 314 mg/dL — ABNORMAL HIGH (ref <150)

## 2013-01-22 LAB — HEPATIC FUNCTION PANEL
ALT: 163 U/L — ABNORMAL HIGH (ref 0–53)
AST: 102 U/L — ABNORMAL HIGH (ref 0–37)
Albumin: 4.9 g/dL (ref 3.5–5.2)
Total Bilirubin: 0.6 mg/dL (ref 0.3–1.2)
Total Protein: 8.4 g/dL — ABNORMAL HIGH (ref 6.0–8.3)

## 2013-01-22 LAB — BASIC METABOLIC PANEL
Chloride: 105 mEq/L (ref 96–112)
Potassium: 5.2 mEq/L (ref 3.5–5.3)
Sodium: 136 mEq/L (ref 135–145)

## 2013-01-22 LAB — HEMOGLOBIN A1C
Hgb A1c MFr Bld: 5.6 % (ref <5.7)
Mean Plasma Glucose: 114 mg/dL (ref <117)

## 2013-01-22 MED ORDER — LEVOTHYROXINE SODIUM 75 MCG PO TABS: 75.0000 ug | ORAL_TABLET | Freq: Every day | ORAL | Status: DC

## 2013-01-22 MED ORDER — CANAGLIFLOZIN 100 MG PO TABS: 100.0000 mg | ORAL_TABLET | Freq: Every morning | ORAL | Status: DC

## 2013-01-22 NOTE — Progress Notes (Signed)
   Subjective:    Patient ID: Joshua Perez, male    DOB: 1950/07/31, 62 y.o.   MRN: [id number]  Diabetes He presents for his follow-up diabetic visit. He has type 2 diabetes mellitus. His disease course has been stable. There are no hypoglycemic associated symptoms. There are no diabetic associated symptoms. There are no hypoglycemic complications. Symptoms are stable. There are no diabetic complications. There are no known risk factors for coronary artery disease. Current diabetic treatment includes oral agent (dual therapy). He is compliant with treatment all of the time.    Last HgbA1C was done on 11/03/12, too early for one today, next one due 02/02/13. No other concerns noted at this time.   States overall asthma is in good control. Minimal wheezing. Minimal shortness of breath.  Claims complete compliance with blood pressure medicine. Trying to watch salt intake. Walking 4-5 days per week.  Compliant with thyroid medications. medications. Claims no symptoms of high or low thyroid. Does not medicine does.  Review of Systems Chronic psychiatric problems followed by specialist no chest pain no dyspnea no weight loss or weight gain no headache no abdominal pain no change in bowel habits no blood in stool ROS otherwise negative    Objective:   Physical Exam  Alert pleasant no apparent distress. HEENT normal. Lungs clear. Heart regular in rhythm. C. diabetic foot exam.      Assessment & Plan:  Impression 1 hypertension good control. #2 type 2 diabetes apparent good control A1c pending. #3 hypothyroidism status uncertain. 4 asthma clinically stable. Plan diet exercise discussed. Appropriate laboratories. Maintain same medications. Followup in several months. Further recommendations based on results. WSL

## 2013-01-23 LAB — MICROALBUMIN, URINE

## 2013-01-23 LAB — PSA, MEDICARE: PSA: 2.03 ng/mL (ref <=4.00)

## 2013-01-25 DIAGNOSIS — E039 Hypothyroidism, unspecified: Secondary | ICD-10-CM | POA: Insufficient documentation

## 2013-02-02 ENCOUNTER — Ambulatory Visit: Payer: Medicare Other | Admitting: Family Medicine

## 2013-02-16 ENCOUNTER — Telehealth: Payer: Self-pay | Admitting: Family Medicine

## 2013-02-16 NOTE — Telephone Encounter (Signed)
We try not to change chronic meds mbased on several weeks of elevated numbers. Cst. Try to increase exercise level some. f u a t reg ck. Will do A1C then and make changes if necessary

## 2013-02-16 NOTE — Telephone Encounter (Signed)
Pt wants to speak wth a nurse regarding is Blood Sugar Levels as soon as possible   They are running about 30pts higher than normal an has been for 2-3 wks now

## 2013-02-16 NOTE — Telephone Encounter (Signed)
Patient called to inquire about his increasing blood sugars. He states in the morning they range from: 140-145, afternoon: 110's, and evening: 110-115. He states these sugars are 30 points higher than normal for the past 2-3 weeks. He denies any symptoms but has a runny nose. Pt states his diet has not changed.

## 2013-02-17 NOTE — Telephone Encounter (Signed)
Patient verbalized understanding  

## 2013-02-19 ENCOUNTER — Other Ambulatory Visit: Payer: Self-pay | Admitting: *Deleted

## 2013-02-19 MED ORDER — CANAGLIFLOZIN 100 MG PO TABS: 100.0000 mg | ORAL_TABLET | Freq: Every morning | ORAL | Status: DC

## 2013-03-18 ENCOUNTER — Telehealth: Payer: Self-pay | Admitting: Family Medicine

## 2013-03-18 NOTE — Telephone Encounter (Signed)
Rx Prior Auth obtained for pt's [facility] 100mg  tablet, expires 03/18/2015 through [facility], called and notified pt's wife

## 2013-04-22 ENCOUNTER — Telehealth: Payer: Self-pay | Admitting: Family Medicine

## 2013-04-22 ENCOUNTER — Encounter: Payer: Self-pay | Admitting: Family Medicine

## 2013-04-22 ENCOUNTER — Ambulatory Visit (INDEPENDENT_AMBULATORY_CARE_PROVIDER_SITE_OTHER): Payer: Medicare HMO | Admitting: Family Medicine

## 2013-04-22 VITALS — BP 130/82 | Ht 73.0 in | Wt 236.8 lb

## 2013-04-22 DIAGNOSIS — E119 Type 2 diabetes mellitus without complications: Secondary | ICD-10-CM

## 2013-04-22 DIAGNOSIS — G252 Other specified forms of tremor: Secondary | ICD-10-CM

## 2013-04-22 DIAGNOSIS — I10 Essential (primary) hypertension: Secondary | ICD-10-CM

## 2013-04-22 DIAGNOSIS — G250 Essential tremor: Secondary | ICD-10-CM

## 2013-04-22 LAB — POCT GLYCOSYLATED HEMOGLOBIN (HGB A1C): HEMOGLOBIN A1C: 4.5

## 2013-04-22 MED ORDER — METFORMIN HCL 500 MG PO TABS: ORAL_TABLET | ORAL | Status: DC

## 2013-04-22 MED ORDER — GLYBURIDE 5 MG PO TABS: ORAL_TABLET | ORAL | Status: DC

## 2013-04-22 MED ORDER — LITHIUM CARBONATE 300 MG PO CAPS: 600.0000 mg | ORAL_CAPSULE | ORAL | Status: DC

## 2013-04-22 MED ORDER — DESMOPRESSIN ACETATE 0.2 MG PO TABS: 0.6000 mg | ORAL_TABLET | Freq: Every day | ORAL | Status: DC

## 2013-04-22 MED ORDER — VENLAFAXINE HCL ER 150 MG PO CP24: 150.0000 mg | ORAL_CAPSULE | Freq: Two times a day (BID) | ORAL | Status: DC

## 2013-04-22 MED ORDER — TRAZODONE HCL 100 MG PO TABS: 200.0000 mg | ORAL_TABLET | Freq: Every day | ORAL | Status: DC

## 2013-04-22 MED ORDER — LEVOTHYROXINE SODIUM 75 MCG PO TABS: 75.0000 ug | ORAL_TABLET | Freq: Every day | ORAL | Status: AC

## 2013-04-22 MED ORDER — METOPROLOL TARTRATE 50 MG PO TABS: ORAL_TABLET | ORAL | Status: DC

## 2013-04-22 MED ORDER — CANAGLIFLOZIN 100 MG PO TABS: 100.0000 mg | ORAL_TABLET | Freq: Every morning | ORAL | Status: DC

## 2013-04-22 NOTE — Patient Instructions (Signed)
This is essential tremor, with likely additive factor of lithium use  Decrease your glyburide tab to one tab twice per day

## 2013-04-22 NOTE — Progress Notes (Signed)
   Subjective:    Patient ID: Joshua Perez, male    DOB: [birth year], 63 y.o.   MRN: [id number]  Diabetes He presents for his follow-up diabetic visit. He has type 2 diabetes mellitus. Current diabetic treatment includes oral agent (triple therapy). He is compliant with treatment all of the time.   Patient arrives for a diabetic check up. Patient would also like to discuss the tremors in his hands and his legs  Results for orders placed in visit on 04/22/13  POCT GLYCOSYLATED HEMOGLOBIN (HGB A1C)      Result Value Ref Range   Hemoglobin A1C 4.5     Sugars generall between 60 and 110.  Wheezing at times. Has not used the inhaler, doesn't use inhaler. Not exercising as much as he had hoped.  sig tremor at this time, worse in the eve, feels may have worsened in the past year.  Claims compliance with blood pressure medicine. No obvious side effects. Review of Systems No chest pain no back pain no abdominal pain no change about habits no blood in stool ROS otherwise negative per    Objective:   Physical Exam  Alert hydration good. Lungs clear. Heart regular in rhythm. HEENT normal. Hands very fine tremor noted.      Assessment & Plan:  Impression 1 type 2 diabetes control 2 type discussed at length. #2 fine tremor likely essential tremor plus accentuation via lithium. #3 hypertension good control. Plan decrease diabetic to 1 tablet twice a day. Rationale discussed. No major workup as far as tremor at this time. Followup in several months. Diet exercise discussed. WSL

## 2013-04-22 NOTE — Telephone Encounter (Signed)
Referral authorization obtained through [facility] for pt to see Dr. Freda Munro, Auth# [id number], good 04/22/13-07/21/13 for 4 visits, referral for 788.30 & 600.00

## 2013-04-25 DIAGNOSIS — G250 Essential tremor: Secondary | ICD-10-CM | POA: Insufficient documentation

## 2013-06-30 ENCOUNTER — Telehealth: Payer: Self-pay | Admitting: Family Medicine

## 2013-06-30 NOTE — Telephone Encounter (Signed)
Surprised he needs it with care maybe [facility]? Ask [place]

## 2013-06-30 NOTE — Telephone Encounter (Signed)
pts spouse states that due to their [facility] coverage they are  Requiring a referral to Dr Yolanda Bonine

## 2013-06-30 NOTE — Telephone Encounter (Signed)
Pt needs a referral to his eye dr, Dr Yolanda Bonine with [facility].  Due to his insurance   appt is already scheduled for June 11th at 10:30

## 2013-07-01 NOTE — Telephone Encounter (Signed)
Referral form completed, faxed to [employer], await approval

## 2014-03-20 ENCOUNTER — Telehealth: Payer: Self-pay | Admitting: Family Medicine

## 2014-03-20 NOTE — Telephone Encounter (Signed)
Pt's glyBURIDE (DIABETA) 5 MG tablet is Non-Formulary, change or try to get exception? Please see letter and copy of formulary in green folder, please advise

## 2014-03-24 ENCOUNTER — Other Ambulatory Visit: Payer: Self-pay | Admitting: *Deleted

## 2014-03-24 MED ORDER — GLIPIZIDE 5 MG PO TABS: 5.0000 mg | ORAL_TABLET | Freq: Two times a day (BID) | ORAL | Status: DC

## 2014-03-26 NOTE — Telephone Encounter (Signed)
Per [facility], Rx was changed to glipiZIDE (GLUCOTROL) 5 MG tablet

## 2014-04-27 ENCOUNTER — Other Ambulatory Visit: Payer: Self-pay | Admitting: Family Medicine

## 2014-06-01 ENCOUNTER — Other Ambulatory Visit: Payer: Self-pay | Admitting: Family Medicine

## 2014-06-02 NOTE — Telephone Encounter (Signed)
Thirty d only ov mandatory

## 2014-12-29 ENCOUNTER — Encounter: Payer: Self-pay | Admitting: Internal Medicine

## 2015-02-25 DIAGNOSIS — Z6828 Body mass index (BMI) 28.0-28.9, adult: Secondary | ICD-10-CM | POA: Diagnosis not present

## 2015-02-25 DIAGNOSIS — I10 Essential (primary) hypertension: Secondary | ICD-10-CM | POA: Diagnosis not present

## 2015-02-25 DIAGNOSIS — E1142 Type 2 diabetes mellitus with diabetic polyneuropathy: Secondary | ICD-10-CM | POA: Diagnosis not present

## 2015-05-12 DIAGNOSIS — Q438 Other specified congenital malformations of intestine: Secondary | ICD-10-CM | POA: Diagnosis not present

## 2015-05-12 DIAGNOSIS — E119 Type 2 diabetes mellitus without complications: Secondary | ICD-10-CM | POA: Diagnosis not present

## 2015-05-12 DIAGNOSIS — I10 Essential (primary) hypertension: Secondary | ICD-10-CM | POA: Diagnosis not present

## 2015-05-12 DIAGNOSIS — F418 Other specified anxiety disorders: Secondary | ICD-10-CM | POA: Diagnosis not present

## 2015-05-12 DIAGNOSIS — Z86010 Personal history of colonic polyps: Secondary | ICD-10-CM | POA: Diagnosis not present

## 2015-05-12 DIAGNOSIS — Z7982 Long term (current) use of aspirin: Secondary | ICD-10-CM | POA: Diagnosis not present

## 2015-05-12 DIAGNOSIS — Z79899 Other long term (current) drug therapy: Secondary | ICD-10-CM | POA: Diagnosis not present

## 2015-05-12 DIAGNOSIS — Z880 Allergy status to penicillin: Secondary | ICD-10-CM | POA: Diagnosis not present

## 2015-05-12 DIAGNOSIS — Z1211 Encounter for screening for malignant neoplasm of colon: Secondary | ICD-10-CM | POA: Diagnosis not present

## 2015-05-12 DIAGNOSIS — Z7984 Long term (current) use of oral hypoglycemic drugs: Secondary | ICD-10-CM | POA: Diagnosis not present

## 2015-05-25 DIAGNOSIS — Z1389 Encounter for screening for other disorder: Secondary | ICD-10-CM | POA: Diagnosis not present

## 2015-05-25 DIAGNOSIS — E1143 Type 2 diabetes mellitus with diabetic autonomic (poly)neuropathy: Secondary | ICD-10-CM | POA: Diagnosis not present

## 2015-05-25 DIAGNOSIS — I10 Essential (primary) hypertension: Secondary | ICD-10-CM | POA: Diagnosis not present

## 2015-05-25 DIAGNOSIS — Z6826 Body mass index (BMI) 26.0-26.9, adult: Secondary | ICD-10-CM | POA: Diagnosis not present

## 2015-05-25 DIAGNOSIS — Z0000 Encounter for general adult medical examination without abnormal findings: Secondary | ICD-10-CM | POA: Diagnosis not present

## 2015-05-25 DIAGNOSIS — R296 Repeated falls: Secondary | ICD-10-CM | POA: Diagnosis not present

## 2015-05-25 DIAGNOSIS — Z125 Encounter for screening for malignant neoplasm of prostate: Secondary | ICD-10-CM | POA: Diagnosis not present

## 2015-05-26 DIAGNOSIS — I959 Hypotension, unspecified: Secondary | ICD-10-CM | POA: Diagnosis not present

## 2015-05-26 DIAGNOSIS — Z9181 History of falling: Secondary | ICD-10-CM | POA: Diagnosis not present

## 2015-05-26 DIAGNOSIS — E038 Other specified hypothyroidism: Secondary | ICD-10-CM | POA: Diagnosis not present

## 2015-06-28 DIAGNOSIS — Z6827 Body mass index (BMI) 27.0-27.9, adult: Secondary | ICD-10-CM | POA: Diagnosis not present

## 2015-06-28 DIAGNOSIS — E1143 Type 2 diabetes mellitus with diabetic autonomic (poly)neuropathy: Secondary | ICD-10-CM | POA: Diagnosis not present

## 2015-07-06 DIAGNOSIS — I10 Essential (primary) hypertension: Secondary | ICD-10-CM | POA: Diagnosis not present

## 2015-07-06 DIAGNOSIS — E119 Type 2 diabetes mellitus without complications: Secondary | ICD-10-CM | POA: Diagnosis not present

## 2015-07-06 DIAGNOSIS — E039 Hypothyroidism, unspecified: Secondary | ICD-10-CM | POA: Diagnosis not present

## 2015-07-06 DIAGNOSIS — Z7984 Long term (current) use of oral hypoglycemic drugs: Secondary | ICD-10-CM | POA: Diagnosis not present

## 2015-07-06 DIAGNOSIS — M810 Age-related osteoporosis without current pathological fracture: Secondary | ICD-10-CM | POA: Diagnosis not present

## 2015-07-06 DIAGNOSIS — Z7982 Long term (current) use of aspirin: Secondary | ICD-10-CM | POA: Diagnosis not present

## 2015-07-06 DIAGNOSIS — F329 Major depressive disorder, single episode, unspecified: Secondary | ICD-10-CM | POA: Diagnosis not present

## 2015-07-06 DIAGNOSIS — F419 Anxiety disorder, unspecified: Secondary | ICD-10-CM | POA: Diagnosis not present

## 2015-07-06 DIAGNOSIS — G629 Polyneuropathy, unspecified: Secondary | ICD-10-CM | POA: Diagnosis not present

## 2015-07-06 DIAGNOSIS — M85851 Other specified disorders of bone density and structure, right thigh: Secondary | ICD-10-CM | POA: Diagnosis not present

## 2015-07-25 DIAGNOSIS — L821 Other seborrheic keratosis: Secondary | ICD-10-CM | POA: Diagnosis not present

## 2015-07-25 DIAGNOSIS — D225 Melanocytic nevi of trunk: Secondary | ICD-10-CM | POA: Diagnosis not present

## 2015-07-25 DIAGNOSIS — D485 Neoplasm of uncertain behavior of skin: Secondary | ICD-10-CM | POA: Diagnosis not present

## 2015-07-25 DIAGNOSIS — L570 Actinic keratosis: Secondary | ICD-10-CM | POA: Diagnosis not present

## 2015-08-04 DIAGNOSIS — D485 Neoplasm of uncertain behavior of skin: Secondary | ICD-10-CM | POA: Diagnosis not present

## 2015-08-04 DIAGNOSIS — L089 Local infection of the skin and subcutaneous tissue, unspecified: Secondary | ICD-10-CM | POA: Diagnosis not present

## 2015-09-15 ENCOUNTER — Ambulatory Visit (INDEPENDENT_AMBULATORY_CARE_PROVIDER_SITE_OTHER): Payer: Commercial Managed Care - HMO | Admitting: Neurology

## 2015-09-15 ENCOUNTER — Encounter: Payer: Self-pay | Admitting: Neurology

## 2015-09-15 DIAGNOSIS — I10 Essential (primary) hypertension: Secondary | ICD-10-CM | POA: Diagnosis not present

## 2015-09-15 DIAGNOSIS — R55 Syncope and collapse: Secondary | ICD-10-CM | POA: Diagnosis not present

## 2015-09-15 DIAGNOSIS — R269 Unspecified abnormalities of gait and mobility: Secondary | ICD-10-CM | POA: Insufficient documentation

## 2015-09-15 DIAGNOSIS — E1143 Type 2 diabetes mellitus with diabetic autonomic (poly)neuropathy: Secondary | ICD-10-CM | POA: Diagnosis not present

## 2015-09-15 DIAGNOSIS — E1142 Type 2 diabetes mellitus with diabetic polyneuropathy: Secondary | ICD-10-CM

## 2015-09-15 HISTORY — DX: Syncope and collapse: R55

## 2015-09-15 HISTORY — DX: Unspecified abnormalities of gait and mobility: R26.9

## 2015-09-15 HISTORY — DX: Type 2 diabetes mellitus with diabetic polyneuropathy: E11.42

## 2015-09-15 NOTE — Patient Instructions (Addendum)
We will check blood work today and get MRI of the brain and the neck. We will check an EEG study.  Fall Prevention in the Home  Falls can cause injuries and can affect people from all age groups. There are many simple things that you can do to make your home safe and to help prevent falls. WHAT CAN I DO ON THE OUTSIDE OF MY HOME?  Regularly repair the edges of walkways and driveways and fix any cracks.  Remove high doorway thresholds.  Trim any shrubbery on the main path into your home.  Use bright outdoor lighting.  Clear walkways of debris and clutter, including tools and rocks.  Regularly check that handrails are securely fastened and in good repair. Both sides of any steps should have handrails.  Install guardrails along the edges of any raised decks or porches.  Have leaves, snow, and ice cleared regularly.  Use sand or salt on walkways during winter months.  In the garage, clean up any spills right away, including grease or oil spills. WHAT CAN I DO IN THE BATHROOM?  Use night lights.  Install grab bars by the toilet and in the tub and shower. Do not use towel bars as grab bars.  Use non-skid mats or decals on the floor of the tub or shower.  If you need to sit down while you are in the shower, use a plastic, non-slip stool.Marland Kitchen  Keep the floor dry. Immediately clean up any water that spills on the floor.  Remove soap buildup in the tub or shower on a regular basis.  Attach bath mats securely with double-sided non-slip rug tape.  Remove throw rugs and other tripping hazards from the floor. WHAT CAN I DO IN THE BEDROOM?  Use night lights.  Make sure that a bedside light is easy to reach.  Do not use oversized bedding that drapes onto the floor.  Have a firm chair that has side arms to use for getting dressed.  Remove throw rugs and other tripping hazards from the floor. WHAT CAN I DO IN THE KITCHEN?   Clean up any spills right away.  Avoid walking on wet  floors.  Place frequently used items in easy-to-reach places.  If you need to reach for something above you, use a sturdy step stool that has a grab bar.  Keep electrical cables out of the way.  Do not use floor polish or wax that makes floors slippery. If you have to use wax, make sure that it is non-skid floor wax.  Remove throw rugs and other tripping hazards from the floor. WHAT CAN I DO IN THE STAIRWAYS?  Do not leave any items on the stairs.  Make sure that there are handrails on both sides of the stairs. Fix handrails that are broken or loose. Make sure that handrails are as long as the stairways.  Check any carpeting to make sure that it is firmly attached to the stairs. Fix any carpet that is loose or worn.  Avoid having throw rugs at the top or bottom of stairways, or secure the rugs with carpet tape to prevent them from moving.  Make sure that you have a light switch at the top of the stairs and the bottom of the stairs. If you do not have them, have them installed. WHAT ARE SOME OTHER FALL PREVENTION TIPS?  Wear closed-toe shoes that fit well and support your feet. Wear shoes that have rubber soles or low heels.  When you use  a stepladder, make sure that it is completely opened and that the sides are firmly locked. Have someone hold the ladder while you are using it. Do not climb a closed stepladder.  Add color or contrast paint or tape to grab bars and handrails in your home. Place contrasting color strips on the first and last steps.  Use mobility aids as needed, such as canes, walkers, scooters, and crutches.  Turn on lights if it is dark. Replace any light bulbs that burn out.  Set up furniture so that there are clear paths. Keep the furniture in the same spot.  Fix any uneven floor surfaces.  Choose a carpet design that does not hide the edge of steps of a stairway.  Be aware of any and all pets.  Review your medicines with your healthcare provider. Some  medicines can cause dizziness or changes in blood pressure, which increase your risk of falling. Talk with your health care provider about other ways that you can decrease your risk of falls. This may include working with a physical therapist or trainer to improve your strength, balance, and endurance.   This information is not intended to replace advice given to you by your health care provider. Make sure you discuss any questions you have with your health care provider.   Document Released: 01/19/2002 Document Revised: 06/15/2014 Document Reviewed: 03/05/2014 Elsevier Interactive Patient Education [employer].

## 2015-09-15 NOTE — Progress Notes (Signed)
Reason for visit: Gait disturbance, syncope  Referring physician: Dr. [name] is a 65 y.o. male  History of present illness:  Joshua Perez is a 65 year old left-handed white male with a history of bipolar disorder. Joshua Perez had been on lithium until about 2 years ago. Joshua Perez indicates that Joshua Perez developed toxic levels of lithium, and Joshua Perez was told to go off Joshua drug. Joshua Perez developed difficulty with walking that came on relatively suddenly around that time, Joshua Perez required a walker for ambulation and eventually graduated to a cane. In November of that year Joshua Perez had an episode of loss of consciousness that was unwitnessed. Joshua Perez was confused for about 5 hours around that time, Joshua Perez hit Joshua Perez head with Joshua fall. Joshua Perez did lose control of Joshua bladder. Joshua Perez did not bite Joshua Perez tongue. Joshua Perez has had another syncopal episode around a year later, again associated with a bump to Joshua head. Joshua Perez believes that Joshua Perez ability to ambulate has gradually worsened over Joshua last year. Joshua Perez had been on lithium for 10-15 years before Joshua Perez stopped Joshua drug. Joshua Perez reports some numbness in Joshua feet associated with diabetes, Joshua Perez takes gabapentin for Joshua discomfort. Joshua Perez has benign prostate enlargement, Joshua Perez has some urgency of Joshua bladder at times. Joshua Perez feels weak all over, with generalized fatigue. Joshua Perez has chronic issues with insomnia. Joshua Perez has had some headache around Joshua right eye that has been present since Joshua Perez fell in November 2 years ago. Joshua Perez reports some occasional episodes of hypoglycemia, this is not a frequent event. Joshua Perez is sent to this office for further evaluation. Joshua Perez indicates that Joshua Perez continues to fall on a regular basis. Joshua episodes of falling have become more frequent. Joshua Perez has in Joshua past had episodes of increased heart rate, Joshua Perez has been placed on a beta blocker for this. Joshua Perez denies any chest pain. Joshua Perez reports that all of Joshua Perez joints hurt. Joshua Perez does have neck  pain and low back pain. Joshua Perez denies pain radiating down Joshua arms or legs. Joshua Perez has developed a tremor mainly on Joshua left hand following cessation of Joshua lithium.  Past Medical History:  Diagnosis Date  . Abnormality of gait 09/15/2015  . Asthma   . Bipolar disorder ([place]) 1965   Onset in childhood; remote history of ECT  . Depression   . Diabetes mellitus, type 2 ([place]) 1980   Recent hemoglobin A1c reportedly 5.3; controlled with single oral agent  . Diabetic peripheral neuropathy ([place]) 09/15/2015  . Erectile dysfunction   . High cholesterol   . Hypertension 1995  . Hypothyroidism   . Imbalance   . Joint pain   . Palpitations    Echocardiogram normal in 07/2007 except for mild LVH; Event recorder-first degree AV block, no symptomatic spells, no significant arrhythmias; stress nuclear negative in 1996  . Syncope and collapse 09/15/2015    Past Surgical History:  Procedure Laterality Date  . APPENDECTOMY    . BIOPSY  09/21/2011   NOT DONE. Unable to remove from [facility] list  . COLONOSCOPY  09/21/2011   RMR: Colonic polyps-treated/removed as described above/ Colonic diverticulosis. Lax sphincter time. Status post segmental biopsies(TUBULOVILLOUS ADENOMA/TUBULAR ADENOMA). Prep marginal. TCS 3 months recommended  . COLONOSCOPY  01/18/2012   RMR: tubular adenoma. Surveillance due Dec 2016  . COLONOSCOPY W/ POLYPECTOMY  2002   Dr. Norberto Sorenson Stark-->46mm sessile trv colon polyp, path unavailable  . HERNIA REPAIR  1956   , right  inguinal  . NASAL SEPTUM SURGERY  1974  . TONSILLECTOMY  1957  . VASECTOMY  1988   ?  Subsequent reversal    Family History  Problem Relation Age of Onset  . Diabetes Father   . Heart attack Father   . Alzheimer's disease Mother   . Diabetes Paternal Grandfather   . Heart disease Paternal Grandfather   . Colon cancer Neg Hx   . Liver disease Neg Hx   . Inflammatory bowel disease Neg Hx     Social history:  reports that Joshua Perez has never smoked. Joshua Perez has never used smokeless  tobacco. Joshua Perez reports that Joshua Perez does not drink alcohol or use drugs.  Medications:  Prior to Admission medications   Medication Sig Start Date End Date Taking? Authorizing Provider  Ascorbic Acid (VITAMIN C) 100 MG tablet Take 500 mg by mouth daily.    Yes Historical Provider, MD  aspirin 81 MG tablet Take 81 mg by mouth daily.     Yes Historical Provider, MD  calcium carbonate (OS-CAL) 600 MG tablet Take 600 mg by mouth daily.   Yes Historical Provider, MD  celecoxib (CELEBREX) 200 MG capsule  06/30/15  Yes Historical Provider, MD  Cholecalciferol (VITAMIN D-3 PO) Take 1 capsule by mouth daily.    Yes Historical Provider, MD  DULoxetine (CYMBALTA) 30 MG capsule  07/29/15  Yes Historical Provider, MD  gabapentin (NEURONTIN) 100 MG capsule  08/01/15  Yes Historical Provider, MD  levothyroxine (SYNTHROID, LEVOTHROID) 75 MCG tablet Take 1 tablet (75 mcg total) by mouth daily. 04/22/13  Yes Mikey Kirschner, MD  Magnesium 400 MG TABS Take 2 tablets by mouth 2 (two) times daily.  08/13/15  Yes Historical Provider, MD  metFORMIN (GLUCOPHAGE) 500 MG tablet Take two tablets twice daily 04/22/13  Yes Mikey Kirschner, MD  metoprolol (LOPRESSOR) 50 MG tablet TAKE ONE TABLET BY MOUTH TWICE DAILY Perez taking differently: Take 25 mg by mouth daily. TAKE ONE TABLET BY MOUTH TWICE DAILY 04/22/13  Yes Mikey Kirschner, MD  Omega-3 Fatty Acids (FISH OIL PO) Take 2 capsules by mouth daily.   Yes Historical Provider, MD  EPINEPHrine (EPIPEN 2-PAK) 0.3 mg/0.3 mL SOAJ injection Inject 0.3 mLs (0.3 mg total) into Joshua muscle once. Perez not taking: Reported on 09/15/2015 11/03/12   Mikey Kirschner, MD      Allergies  Allergen Reactions  . Bee Venom Anaphylaxis  . Penicillins Shortness Of Breath, Swelling and Rash    Augmentin    ROS:  Out of a complete 14 system review of symptoms, Joshua Perez complains only of Joshua following symptoms, and all other reviewed systems are negative.  Fatigue Hearing loss, ringing in  Joshua ears, dizziness Loss of vision Impotence Joint pain Allergies, runny nose Memory loss, confusion, syncope, tremor Depression, not enough sleep Insomnia, sleepiness  Blood pressure 122/74, pulse (!) 49, height 6\' 1"  (1.854 m), weight 216 lb 8 oz (98.2 kg).  Physical Exam  General: Joshua Perez is alert and cooperative at Joshua time of Joshua examination.  Eyes: Pupils are equal, round, and reactive to light. Discs are flat bilaterally.  Neck: Joshua neck is supple, no carotid bruits are noted.  Respiratory: Joshua respiratory examination is clear.  Cardiovascular: Joshua cardiovascular examination reveals a regular rate and rhythm, no obvious murmurs or rubs are noted.  Neuromuscular: Range of movement of Joshua cervical spine lacks only about 10 of full lateral rotation of Joshua cervical spine bilaterally.  Skin: Extremities are without significant  edema.  Neurologic Exam  Mental status: Joshua Perez is alert and oriented x 3 at Joshua time of Joshua examination. Joshua Perez has apparent normal recent and remote memory, with an apparently normal attention span and concentration ability.  Cranial nerves: Facial symmetry is present. There is good sensation of Joshua face to pinprick and soft touch bilaterally. Joshua strength of Joshua facial muscles and Joshua muscles to head turning and shoulder shrug are normal bilaterally. Speech is well enunciated, no aphasia or dysarthria is noted. Extraocular movements are full. Visual fields are full. Joshua tongue is midline, and Joshua Perez has symmetric elevation of Joshua soft palate. No obvious hearing deficits are noted.  Motor: Joshua motor testing reveals 5 over 5 strength of all 4 extremities. Good symmetric motor tone is noted throughout.  Sensory: Sensory testing is intact to pinprick, soft touch, vibration sensation, and position sense on all 4 extremities, with exception of a stocking pattern pinprick sensory deficit across Joshua ankles bilaterally. No evidence of  extinction is noted.  Coordination: Cerebellar testing reveals good finger-nose-finger  bilaterally. Joshua Perez has mild dysmetria with heel-to-shin bilaterally.  Gait and station: Gait is wide-based, unsteady. Joshua Perez uses a cane for ambulation. Tandem gait was not attempted. Romberg is positive.  Reflexes: Deep tendon reflexes are symmetric and normal bilaterally, with exception of some depression of Joshua right biceps reflex compared to Joshua left. Joshua ankle jerk reflexes are well-maintained bilaterally. Toes are downgoing bilaterally.   Assessment/Plan:  1. Gait disturbance  2. Bipolar disorder  3. Syncope  4. Diabetic peripheral neuropathy  Joshua Perez may have a diabetic peripheral neuropathy, but Joshua severity of neuropathy is very mild, and likely has nothing to do with Joshua Perez ability to ambulate. Lithium can result in permanent neurotoxicity. By history Joshua onset of Joshua gait problem occurred around a period of time where Joshua lithium was toxic. Joshua Perez reports ongoing progression of Joshua Perez gait disorder, however. Joshua Perez also has had 2 episodes of loss of consciousness, it is possible that these events may have been associated with head trauma. Joshua Perez will undergo blood work today, Joshua Perez will have an EEG study. Joshua Perez will undergo MRI of Joshua brain and cervical spine. A cervical myelopathy does need to be excluded. Joshua Perez will follow-up in 3-4 months. Joshua Perez claims that Joshua Perez has already had physical therapy and this worsened Joshua Perez symptoms of joint discomfort. Joshua Perez does not wish to undergo physical therapy again.  Jill Alexanders MD 09/15/2015 10:03 AM  [facility] [address] [place], [place] [address]  Phone [phone] Fax [phone]

## 2015-09-16 LAB — HIV ANTIBODY (ROUTINE TESTING W REFLEX): HIV SCREEN 4TH GENERATION: NONREACTIVE

## 2015-09-16 LAB — ANA W/REFLEX: Anti Nuclear Antibody(ANA): NEGATIVE

## 2015-09-16 LAB — VITAMIN B12: VITAMIN B 12: 562 pg/mL (ref 211–946)

## 2015-09-16 LAB — RPR: RPR: NONREACTIVE

## 2015-09-16 LAB — B. BURGDORFI ANTIBODIES

## 2015-09-16 LAB — SEDIMENTATION RATE: SED RATE: 2 mm/h (ref 0–30)

## 2015-09-17 DIAGNOSIS — Z6828 Body mass index (BMI) 28.0-28.9, adult: Secondary | ICD-10-CM | POA: Diagnosis not present

## 2015-09-17 DIAGNOSIS — I10 Essential (primary) hypertension: Secondary | ICD-10-CM | POA: Diagnosis not present

## 2015-09-17 DIAGNOSIS — H9190 Unspecified hearing loss, unspecified ear: Secondary | ICD-10-CM | POA: Diagnosis not present

## 2015-09-17 DIAGNOSIS — Z7982 Long term (current) use of aspirin: Secondary | ICD-10-CM | POA: Diagnosis not present

## 2015-09-17 DIAGNOSIS — G629 Polyneuropathy, unspecified: Secondary | ICD-10-CM | POA: Diagnosis not present

## 2015-09-17 DIAGNOSIS — E039 Hypothyroidism, unspecified: Secondary | ICD-10-CM | POA: Diagnosis not present

## 2015-09-17 DIAGNOSIS — R42 Dizziness and giddiness: Secondary | ICD-10-CM | POA: Diagnosis not present

## 2015-09-17 DIAGNOSIS — F319 Bipolar disorder, unspecified: Secondary | ICD-10-CM | POA: Diagnosis not present

## 2015-09-17 DIAGNOSIS — M170 Bilateral primary osteoarthritis of knee: Secondary | ICD-10-CM | POA: Diagnosis not present

## 2015-09-19 ENCOUNTER — Telehealth: Payer: Self-pay

## 2015-09-19 NOTE — Telephone Encounter (Signed)
-----   Message from Kathrynn Ducking, MD sent at 09/18/2015  4:26 PM EDT -----  The blood work results are unremarkable. Please call the patient.  ----- Message ----- From: Lavone Neri Lab Results In Sent: 09/16/2015   7:43 AM To: Kathrynn Ducking, MD

## 2015-09-19 NOTE — Telephone Encounter (Signed)
Called w/ unremarkable lab results. Spoke to wife. She reports that pt was taken off of metoprolol d/t low HR. PCP feels that this may be contributing to falls. Agreed to bring additional paperwork from PCP when returning for EEG next week. Verbalized understanding and appreciation for call.

## 2015-09-26 ENCOUNTER — Telehealth: Payer: Self-pay | Admitting: Neurology

## 2015-09-26 ENCOUNTER — Ambulatory Visit (INDEPENDENT_AMBULATORY_CARE_PROVIDER_SITE_OTHER): Payer: Commercial Managed Care - HMO | Admitting: Diagnostic Neuroimaging

## 2015-09-26 DIAGNOSIS — R55 Syncope and collapse: Secondary | ICD-10-CM

## 2015-09-26 DIAGNOSIS — R269 Unspecified abnormalities of gait and mobility: Secondary | ICD-10-CM

## 2015-09-26 NOTE — Telephone Encounter (Signed)
Patient daughter said they haven't got a call yet about scheduling his MRI. The best number to contact is [phone]

## 2015-09-29 ENCOUNTER — Telehealth: Payer: Self-pay | Admitting: Neurology

## 2015-09-29 NOTE — Telephone Encounter (Signed)
I called patient. EEG study was unremarkable.  The patient is being evaluated as well for gait problems, MRI the brain and cervical spine will be done in the near future.

## 2015-09-29 NOTE — Procedures (Signed)
    History:  Joshua Perez is a 65 year old gentleman with a history of bipolar disorder. The patient has had difficulty walking after an episode of lithium toxicity two years ago that has never completely improved. The patient had an event of loss of consciousness in November 2015 with confusion lasting 5 hours. The patient does have a history of occasional episodes of hypoglycemia. The patient has gait instability with occasional falls. He is being evaluated for the episode of loss of consciousness.  This is a routine EEG. No skull defects are noted. Medications include vitamin C, aspirin, calcium supplementation, Celebrex, vitamin D, Cymbalta, gabapentin, Synthroid, using supplementation, metformin, Lopressor, and fish oil.   EEG classification: Normal awake  Description of the recording: The background rhythms of this recording consists of a fairly well modulated medium amplitude alpha rhythm of 9 Hz that is reactive to eye opening and closure. As the record progresses, the patient appears to remain in the waking state throughout the recording. Photic stimulation was performed, resulting in a bilateral and symmetric photic driving response. Hyperventilation was also performed, resulting in a minimal buildup of the background rhythm activities without significant slowing seen. At no time during the recording does there appear to be evidence of spike or spike wave discharges or evidence of focal slowing. EKG monitor shows no evidence of cardiac rhythm abnormalities with a heart rate of 56.  Impression: This is a normal EEG recording in the waking state. No evidence of ictal or interictal discharges are seen.

## 2015-10-04 ENCOUNTER — Telehealth: Payer: Self-pay | Admitting: Neurology

## 2015-10-04 DIAGNOSIS — E1143 Type 2 diabetes mellitus with diabetic autonomic (poly)neuropathy: Secondary | ICD-10-CM | POA: Diagnosis not present

## 2015-10-04 DIAGNOSIS — Z6828 Body mass index (BMI) 28.0-28.9, adult: Secondary | ICD-10-CM | POA: Diagnosis not present

## 2015-10-04 DIAGNOSIS — R008 Other abnormalities of heart beat: Secondary | ICD-10-CM | POA: Diagnosis not present

## 2015-10-04 NOTE — Telephone Encounter (Signed)
[facility] scheduling [phone] opt 3 calling to inquire if PA for [facility] had been done pt has MRI's that are scheduled this Friday 8/25

## 2015-10-04 NOTE — Telephone Encounter (Signed)
Spoke with Dionne Bucy scheduler and informed her that the two MRI orders are without contrast. She stated their protocol for diabetic patients is to have recent BUN, creatinine even for MRI without contrast. Informed her that the patient lives in [place], and this office is in [place]. It would be an inconvenience for patient to come to [facility] for labs only. Advised that he may have recent labs from his PCP, Dr Sherrie Sport. Urban Gibson stated she will call his PCP's office tomorrow to check on that, and call this RN back.  She verbalized understanding, appreciation of call back.

## 2015-10-04 NOTE — Telephone Encounter (Signed)
[facility] scheduler [phone] opt 3 called inquiring if labs hav been done recently. Pt is scheduled for MRI on 10/07/15. If not she will need an order fax to [phone].

## 2015-10-05 NOTE — Telephone Encounter (Signed)
[facility] Scheduling [phone] option 3, called to advise, patient did labwork at PCP appointment yesterday, shouldn't need lab order from our office.

## 2015-10-05 NOTE — Telephone Encounter (Signed)
Informed by Mechele Claude that they would have to cancel the patients apt by tomorrow afternoon if the approval has not been received. Will call to check status of approval.

## 2015-10-05 NOTE — Telephone Encounter (Signed)
[facility] ext [phone] called to to inquire if PA was done for MRI, patient is scheduled for Friday August 25th.

## 2015-10-12 ENCOUNTER — Telehealth: Payer: Self-pay | Admitting: Neurology

## 2015-10-12 DIAGNOSIS — M47812 Spondylosis without myelopathy or radiculopathy, cervical region: Secondary | ICD-10-CM | POA: Diagnosis not present

## 2015-10-12 DIAGNOSIS — R51 Headache: Secondary | ICD-10-CM | POA: Diagnosis not present

## 2015-10-12 DIAGNOSIS — R269 Unspecified abnormalities of gait and mobility: Secondary | ICD-10-CM | POA: Diagnosis not present

## 2015-10-12 DIAGNOSIS — R55 Syncope and collapse: Secondary | ICD-10-CM | POA: Diagnosis not present

## 2015-10-12 DIAGNOSIS — M50221 Other cervical disc displacement at C4-C5 level: Secondary | ICD-10-CM | POA: Diagnosis not present

## 2015-10-12 DIAGNOSIS — M5021 Other cervical disc displacement,  high cervical region: Secondary | ICD-10-CM | POA: Diagnosis not present

## 2015-10-12 DIAGNOSIS — Z9181 History of falling: Secondary | ICD-10-CM | POA: Diagnosis not present

## 2015-10-12 NOTE — Telephone Encounter (Signed)
Spoke to pt's wife, Dorian Pod, who is concerned about pt's frequent falls due to his imbalance.  He had his MRI this morning at [facility] and they are sending a disc for review. Says his heart rate has been running in the 40's and his PCP is in the process of changing his Lopressor.  He is not interested in PT because it has not been helpful in the past.  She is wondering if there is anything else that can be done to help his symptoms.

## 2015-10-12 NOTE — Telephone Encounter (Signed)
Pt's wife called said she would like for the pt to be seen sooner than November. She said he his falling a lot and she wants to talk with Dr Viona Gilmore about what can be done to help him if anything.

## 2015-10-14 DIAGNOSIS — E1143 Type 2 diabetes mellitus with diabetic autonomic (poly)neuropathy: Secondary | ICD-10-CM | POA: Diagnosis not present

## 2015-10-14 DIAGNOSIS — I10 Essential (primary) hypertension: Secondary | ICD-10-CM | POA: Diagnosis not present

## 2015-10-20 ENCOUNTER — Telehealth: Payer: Self-pay | Admitting: Neurology

## 2015-10-20 DIAGNOSIS — R269 Unspecified abnormalities of gait and mobility: Secondary | ICD-10-CM

## 2015-10-20 NOTE — Telephone Encounter (Signed)
I called patient. MRI of the brain was relatively unremarkable, compared to a study done in 2009, there has been no change. MRI of the cervical spine does not show evidence of spinal cord compression that would affect the walking. There is evidence of spondylosis at C5-6 level with some mild spinal stenosis, bilateral neuroforaminal narrowing was seen at this level. There is a disc bulge at the C3-4 level without spinal cord compression. The spinal cord appears normal. These studies do not explain the walking issue, I will set patient up for EMG and nerve conduction study evaluation. Blood work done previously was unremarkable.

## 2015-11-15 ENCOUNTER — Encounter: Payer: Self-pay | Admitting: Neurology

## 2015-11-15 ENCOUNTER — Ambulatory Visit (INDEPENDENT_AMBULATORY_CARE_PROVIDER_SITE_OTHER): Payer: Self-pay | Admitting: Neurology

## 2015-11-15 DIAGNOSIS — R269 Unspecified abnormalities of gait and mobility: Secondary | ICD-10-CM

## 2015-11-15 NOTE — Progress Notes (Signed)
The patient refused EMG and nerve conduction study evaluation.

## 2015-12-16 DIAGNOSIS — I10 Essential (primary) hypertension: Secondary | ICD-10-CM | POA: Diagnosis not present

## 2015-12-16 DIAGNOSIS — E1143 Type 2 diabetes mellitus with diabetic autonomic (poly)neuropathy: Secondary | ICD-10-CM | POA: Diagnosis not present

## 2015-12-28 DIAGNOSIS — E119 Type 2 diabetes mellitus without complications: Secondary | ICD-10-CM | POA: Diagnosis not present

## 2015-12-28 DIAGNOSIS — Z961 Presence of intraocular lens: Secondary | ICD-10-CM | POA: Diagnosis not present

## 2016-01-04 DIAGNOSIS — Z6827 Body mass index (BMI) 27.0-27.9, adult: Secondary | ICD-10-CM | POA: Diagnosis not present

## 2016-01-04 DIAGNOSIS — E038 Other specified hypothyroidism: Secondary | ICD-10-CM | POA: Diagnosis not present

## 2016-01-04 DIAGNOSIS — F3289 Other specified depressive episodes: Secondary | ICD-10-CM | POA: Diagnosis not present

## 2016-01-04 DIAGNOSIS — R008 Other abnormalities of heart beat: Secondary | ICD-10-CM | POA: Diagnosis not present

## 2016-01-04 DIAGNOSIS — E1143 Type 2 diabetes mellitus with diabetic autonomic (poly)neuropathy: Secondary | ICD-10-CM | POA: Diagnosis not present

## 2016-01-13 DIAGNOSIS — E1143 Type 2 diabetes mellitus with diabetic autonomic (poly)neuropathy: Secondary | ICD-10-CM | POA: Diagnosis not present

## 2016-01-13 DIAGNOSIS — I10 Essential (primary) hypertension: Secondary | ICD-10-CM | POA: Diagnosis not present

## 2016-01-20 ENCOUNTER — Ambulatory Visit: Payer: Commercial Managed Care - HMO | Admitting: Neurology

## 2016-02-14 DIAGNOSIS — I10 Essential (primary) hypertension: Secondary | ICD-10-CM | POA: Diagnosis not present

## 2016-02-14 DIAGNOSIS — E1143 Type 2 diabetes mellitus with diabetic autonomic (poly)neuropathy: Secondary | ICD-10-CM | POA: Diagnosis not present

## 2016-03-15 DIAGNOSIS — I10 Essential (primary) hypertension: Secondary | ICD-10-CM | POA: Diagnosis not present

## 2016-03-15 DIAGNOSIS — E1143 Type 2 diabetes mellitus with diabetic autonomic (poly)neuropathy: Secondary | ICD-10-CM | POA: Diagnosis not present

## 2016-04-12 DIAGNOSIS — E1143 Type 2 diabetes mellitus with diabetic autonomic (poly)neuropathy: Secondary | ICD-10-CM | POA: Diagnosis not present

## 2016-04-12 DIAGNOSIS — I10 Essential (primary) hypertension: Secondary | ICD-10-CM | POA: Diagnosis not present

## 2016-05-18 DIAGNOSIS — E1143 Type 2 diabetes mellitus with diabetic autonomic (poly)neuropathy: Secondary | ICD-10-CM | POA: Diagnosis not present

## 2016-05-18 DIAGNOSIS — I10 Essential (primary) hypertension: Secondary | ICD-10-CM | POA: Diagnosis not present

## 2016-06-12 DIAGNOSIS — I10 Essential (primary) hypertension: Secondary | ICD-10-CM | POA: Diagnosis not present

## 2016-06-12 DIAGNOSIS — E1143 Type 2 diabetes mellitus with diabetic autonomic (poly)neuropathy: Secondary | ICD-10-CM | POA: Diagnosis not present

## 2016-07-06 DIAGNOSIS — E1143 Type 2 diabetes mellitus with diabetic autonomic (poly)neuropathy: Secondary | ICD-10-CM | POA: Diagnosis not present

## 2016-07-06 DIAGNOSIS — E038 Other specified hypothyroidism: Secondary | ICD-10-CM | POA: Diagnosis not present

## 2016-07-06 DIAGNOSIS — Z79899 Other long term (current) drug therapy: Secondary | ICD-10-CM | POA: Diagnosis not present

## 2016-07-06 DIAGNOSIS — Z6829 Body mass index (BMI) 29.0-29.9, adult: Secondary | ICD-10-CM | POA: Diagnosis not present

## 2016-07-06 DIAGNOSIS — Z0000 Encounter for general adult medical examination without abnormal findings: Secondary | ICD-10-CM | POA: Diagnosis not present

## 2016-07-06 DIAGNOSIS — Z1389 Encounter for screening for other disorder: Secondary | ICD-10-CM | POA: Diagnosis not present

## 2016-07-06 DIAGNOSIS — F3289 Other specified depressive episodes: Secondary | ICD-10-CM | POA: Diagnosis not present

## 2016-07-19 DIAGNOSIS — Z6829 Body mass index (BMI) 29.0-29.9, adult: Secondary | ICD-10-CM | POA: Diagnosis not present

## 2016-07-19 DIAGNOSIS — L03111 Cellulitis of right axilla: Secondary | ICD-10-CM | POA: Diagnosis not present

## 2016-07-24 DIAGNOSIS — L570 Actinic keratosis: Secondary | ICD-10-CM | POA: Diagnosis not present

## 2016-07-24 DIAGNOSIS — D225 Melanocytic nevi of trunk: Secondary | ICD-10-CM | POA: Diagnosis not present

## 2016-07-24 DIAGNOSIS — D485 Neoplasm of uncertain behavior of skin: Secondary | ICD-10-CM | POA: Diagnosis not present

## 2016-07-24 DIAGNOSIS — D2262 Melanocytic nevi of left upper limb, including shoulder: Secondary | ICD-10-CM | POA: Diagnosis not present

## 2016-07-24 DIAGNOSIS — D1800 Hemangioma unspecified site: Secondary | ICD-10-CM | POA: Diagnosis not present

## 2016-07-26 DIAGNOSIS — I10 Essential (primary) hypertension: Secondary | ICD-10-CM | POA: Diagnosis not present

## 2016-07-26 DIAGNOSIS — E1143 Type 2 diabetes mellitus with diabetic autonomic (poly)neuropathy: Secondary | ICD-10-CM | POA: Diagnosis not present

## 2016-09-05 DIAGNOSIS — E1143 Type 2 diabetes mellitus with diabetic autonomic (poly)neuropathy: Secondary | ICD-10-CM | POA: Diagnosis not present

## 2016-09-05 DIAGNOSIS — I10 Essential (primary) hypertension: Secondary | ICD-10-CM | POA: Diagnosis not present

## 2016-09-06 DIAGNOSIS — D485 Neoplasm of uncertain behavior of skin: Secondary | ICD-10-CM | POA: Diagnosis not present

## 2016-09-06 DIAGNOSIS — L988 Other specified disorders of the skin and subcutaneous tissue: Secondary | ICD-10-CM | POA: Diagnosis not present

## 2016-09-12 DIAGNOSIS — M47817 Spondylosis without myelopathy or radiculopathy, lumbosacral region: Secondary | ICD-10-CM | POA: Diagnosis not present

## 2016-09-12 DIAGNOSIS — M48061 Spinal stenosis, lumbar region without neurogenic claudication: Secondary | ICD-10-CM | POA: Diagnosis not present

## 2016-09-12 DIAGNOSIS — M9983 Other biomechanical lesions of lumbar region: Secondary | ICD-10-CM | POA: Diagnosis not present

## 2016-09-12 DIAGNOSIS — M47816 Spondylosis without myelopathy or radiculopathy, lumbar region: Secondary | ICD-10-CM | POA: Diagnosis not present

## 2016-09-12 DIAGNOSIS — M5126 Other intervertebral disc displacement, lumbar region: Secondary | ICD-10-CM | POA: Diagnosis not present

## 2016-10-12 DIAGNOSIS — Z6830 Body mass index (BMI) 30.0-30.9, adult: Secondary | ICD-10-CM | POA: Diagnosis not present

## 2016-10-12 DIAGNOSIS — E668 Other obesity: Secondary | ICD-10-CM | POA: Diagnosis not present

## 2016-10-12 DIAGNOSIS — I10 Essential (primary) hypertension: Secondary | ICD-10-CM | POA: Diagnosis not present

## 2016-10-12 DIAGNOSIS — E784 Other hyperlipidemia: Secondary | ICD-10-CM | POA: Diagnosis not present

## 2016-10-12 DIAGNOSIS — E1143 Type 2 diabetes mellitus with diabetic autonomic (poly)neuropathy: Secondary | ICD-10-CM | POA: Diagnosis not present

## 2016-10-12 DIAGNOSIS — E119 Type 2 diabetes mellitus without complications: Secondary | ICD-10-CM | POA: Diagnosis not present

## 2016-10-25 DIAGNOSIS — E1143 Type 2 diabetes mellitus with diabetic autonomic (poly)neuropathy: Secondary | ICD-10-CM | POA: Diagnosis not present

## 2016-10-25 DIAGNOSIS — I10 Essential (primary) hypertension: Secondary | ICD-10-CM | POA: Diagnosis not present

## 2016-12-10 DIAGNOSIS — E119 Type 2 diabetes mellitus without complications: Secondary | ICD-10-CM | POA: Diagnosis not present

## 2016-12-10 DIAGNOSIS — I10 Essential (primary) hypertension: Secondary | ICD-10-CM | POA: Diagnosis not present

## 2016-12-10 DIAGNOSIS — E7849 Other hyperlipidemia: Secondary | ICD-10-CM | POA: Diagnosis not present

## 2016-12-19 DIAGNOSIS — M47816 Spondylosis without myelopathy or radiculopathy, lumbar region: Secondary | ICD-10-CM | POA: Diagnosis not present

## 2016-12-19 DIAGNOSIS — M545 Low back pain: Secondary | ICD-10-CM | POA: Diagnosis not present

## 2016-12-19 DIAGNOSIS — M9903 Segmental and somatic dysfunction of lumbar region: Secondary | ICD-10-CM | POA: Diagnosis not present

## 2016-12-19 DIAGNOSIS — M5441 Lumbago with sciatica, right side: Secondary | ICD-10-CM | POA: Diagnosis not present

## 2016-12-24 DIAGNOSIS — M5441 Lumbago with sciatica, right side: Secondary | ICD-10-CM | POA: Diagnosis not present

## 2016-12-24 DIAGNOSIS — M545 Low back pain: Secondary | ICD-10-CM | POA: Diagnosis not present

## 2016-12-24 DIAGNOSIS — M9903 Segmental and somatic dysfunction of lumbar region: Secondary | ICD-10-CM | POA: Diagnosis not present

## 2016-12-24 DIAGNOSIS — M47816 Spondylosis without myelopathy or radiculopathy, lumbar region: Secondary | ICD-10-CM | POA: Diagnosis not present

## 2016-12-27 DIAGNOSIS — M545 Low back pain: Secondary | ICD-10-CM | POA: Diagnosis not present

## 2016-12-27 DIAGNOSIS — M47816 Spondylosis without myelopathy or radiculopathy, lumbar region: Secondary | ICD-10-CM | POA: Diagnosis not present

## 2016-12-27 DIAGNOSIS — M9903 Segmental and somatic dysfunction of lumbar region: Secondary | ICD-10-CM | POA: Diagnosis not present

## 2016-12-27 DIAGNOSIS — M5441 Lumbago with sciatica, right side: Secondary | ICD-10-CM | POA: Diagnosis not present

## 2016-12-31 DIAGNOSIS — M5441 Lumbago with sciatica, right side: Secondary | ICD-10-CM | POA: Diagnosis not present

## 2016-12-31 DIAGNOSIS — M47816 Spondylosis without myelopathy or radiculopathy, lumbar region: Secondary | ICD-10-CM | POA: Diagnosis not present

## 2016-12-31 DIAGNOSIS — M9903 Segmental and somatic dysfunction of lumbar region: Secondary | ICD-10-CM | POA: Diagnosis not present

## 2016-12-31 DIAGNOSIS — M545 Low back pain: Secondary | ICD-10-CM | POA: Diagnosis not present

## 2017-01-01 DIAGNOSIS — E7849 Other hyperlipidemia: Secondary | ICD-10-CM | POA: Diagnosis not present

## 2017-01-01 DIAGNOSIS — E119 Type 2 diabetes mellitus without complications: Secondary | ICD-10-CM | POA: Diagnosis not present

## 2017-01-01 DIAGNOSIS — I10 Essential (primary) hypertension: Secondary | ICD-10-CM | POA: Diagnosis not present

## 2017-01-02 DIAGNOSIS — M9903 Segmental and somatic dysfunction of lumbar region: Secondary | ICD-10-CM | POA: Diagnosis not present

## 2017-01-02 DIAGNOSIS — M47816 Spondylosis without myelopathy or radiculopathy, lumbar region: Secondary | ICD-10-CM | POA: Diagnosis not present

## 2017-01-02 DIAGNOSIS — M5441 Lumbago with sciatica, right side: Secondary | ICD-10-CM | POA: Diagnosis not present

## 2017-01-02 DIAGNOSIS — M545 Low back pain: Secondary | ICD-10-CM | POA: Diagnosis not present

## 2017-01-07 DIAGNOSIS — M9903 Segmental and somatic dysfunction of lumbar region: Secondary | ICD-10-CM | POA: Diagnosis not present

## 2017-01-07 DIAGNOSIS — M545 Low back pain: Secondary | ICD-10-CM | POA: Diagnosis not present

## 2017-01-07 DIAGNOSIS — M5441 Lumbago with sciatica, right side: Secondary | ICD-10-CM | POA: Diagnosis not present

## 2017-01-07 DIAGNOSIS — M47816 Spondylosis without myelopathy or radiculopathy, lumbar region: Secondary | ICD-10-CM | POA: Diagnosis not present

## 2017-01-09 DIAGNOSIS — E119 Type 2 diabetes mellitus without complications: Secondary | ICD-10-CM | POA: Diagnosis not present

## 2017-01-10 DIAGNOSIS — M5441 Lumbago with sciatica, right side: Secondary | ICD-10-CM | POA: Diagnosis not present

## 2017-01-10 DIAGNOSIS — M9903 Segmental and somatic dysfunction of lumbar region: Secondary | ICD-10-CM | POA: Diagnosis not present

## 2017-01-10 DIAGNOSIS — M47816 Spondylosis without myelopathy or radiculopathy, lumbar region: Secondary | ICD-10-CM | POA: Diagnosis not present

## 2017-01-10 DIAGNOSIS — M545 Low back pain: Secondary | ICD-10-CM | POA: Diagnosis not present

## 2017-01-14 DIAGNOSIS — M545 Low back pain: Secondary | ICD-10-CM | POA: Diagnosis not present

## 2017-01-14 DIAGNOSIS — M5441 Lumbago with sciatica, right side: Secondary | ICD-10-CM | POA: Diagnosis not present

## 2017-01-14 DIAGNOSIS — M47816 Spondylosis without myelopathy or radiculopathy, lumbar region: Secondary | ICD-10-CM | POA: Diagnosis not present

## 2017-01-14 DIAGNOSIS — M9903 Segmental and somatic dysfunction of lumbar region: Secondary | ICD-10-CM | POA: Diagnosis not present

## 2017-01-15 DIAGNOSIS — E119 Type 2 diabetes mellitus without complications: Secondary | ICD-10-CM | POA: Diagnosis not present

## 2017-01-15 DIAGNOSIS — I10 Essential (primary) hypertension: Secondary | ICD-10-CM | POA: Diagnosis not present

## 2017-01-15 DIAGNOSIS — E668 Other obesity: Secondary | ICD-10-CM | POA: Diagnosis not present

## 2017-01-15 DIAGNOSIS — E7849 Other hyperlipidemia: Secondary | ICD-10-CM | POA: Diagnosis not present

## 2017-01-15 DIAGNOSIS — Z6832 Body mass index (BMI) 32.0-32.9, adult: Secondary | ICD-10-CM | POA: Diagnosis not present

## 2017-01-17 DIAGNOSIS — M9903 Segmental and somatic dysfunction of lumbar region: Secondary | ICD-10-CM | POA: Diagnosis not present

## 2017-01-17 DIAGNOSIS — M545 Low back pain: Secondary | ICD-10-CM | POA: Diagnosis not present

## 2017-01-17 DIAGNOSIS — M5441 Lumbago with sciatica, right side: Secondary | ICD-10-CM | POA: Diagnosis not present

## 2017-01-17 DIAGNOSIS — M47816 Spondylosis without myelopathy or radiculopathy, lumbar region: Secondary | ICD-10-CM | POA: Diagnosis not present

## 2017-01-25 DIAGNOSIS — M9903 Segmental and somatic dysfunction of lumbar region: Secondary | ICD-10-CM | POA: Diagnosis not present

## 2017-01-25 DIAGNOSIS — M545 Low back pain: Secondary | ICD-10-CM | POA: Diagnosis not present

## 2017-01-25 DIAGNOSIS — M47816 Spondylosis without myelopathy or radiculopathy, lumbar region: Secondary | ICD-10-CM | POA: Diagnosis not present

## 2017-01-25 DIAGNOSIS — M5441 Lumbago with sciatica, right side: Secondary | ICD-10-CM | POA: Diagnosis not present

## 2017-02-01 DIAGNOSIS — M5441 Lumbago with sciatica, right side: Secondary | ICD-10-CM | POA: Diagnosis not present

## 2017-02-01 DIAGNOSIS — M9903 Segmental and somatic dysfunction of lumbar region: Secondary | ICD-10-CM | POA: Diagnosis not present

## 2017-02-01 DIAGNOSIS — M545 Low back pain: Secondary | ICD-10-CM | POA: Diagnosis not present

## 2017-02-01 DIAGNOSIS — M47816 Spondylosis without myelopathy or radiculopathy, lumbar region: Secondary | ICD-10-CM | POA: Diagnosis not present

## 2017-02-08 DIAGNOSIS — M5441 Lumbago with sciatica, right side: Secondary | ICD-10-CM | POA: Diagnosis not present

## 2017-02-08 DIAGNOSIS — M47816 Spondylosis without myelopathy or radiculopathy, lumbar region: Secondary | ICD-10-CM | POA: Diagnosis not present

## 2017-02-08 DIAGNOSIS — M545 Low back pain: Secondary | ICD-10-CM | POA: Diagnosis not present

## 2017-02-08 DIAGNOSIS — M9903 Segmental and somatic dysfunction of lumbar region: Secondary | ICD-10-CM | POA: Diagnosis not present

## 2017-02-11 DIAGNOSIS — E119 Type 2 diabetes mellitus without complications: Secondary | ICD-10-CM | POA: Diagnosis not present

## 2017-02-11 DIAGNOSIS — I10 Essential (primary) hypertension: Secondary | ICD-10-CM | POA: Diagnosis not present

## 2017-02-11 DIAGNOSIS — E7849 Other hyperlipidemia: Secondary | ICD-10-CM | POA: Diagnosis not present

## 2017-02-15 DIAGNOSIS — M5441 Lumbago with sciatica, right side: Secondary | ICD-10-CM | POA: Diagnosis not present

## 2017-02-15 DIAGNOSIS — M9903 Segmental and somatic dysfunction of lumbar region: Secondary | ICD-10-CM | POA: Diagnosis not present

## 2017-02-15 DIAGNOSIS — M545 Low back pain: Secondary | ICD-10-CM | POA: Diagnosis not present

## 2017-02-15 DIAGNOSIS — M47816 Spondylosis without myelopathy or radiculopathy, lumbar region: Secondary | ICD-10-CM | POA: Diagnosis not present

## 2017-02-22 DIAGNOSIS — M545 Low back pain: Secondary | ICD-10-CM | POA: Diagnosis not present

## 2017-02-22 DIAGNOSIS — M9903 Segmental and somatic dysfunction of lumbar region: Secondary | ICD-10-CM | POA: Diagnosis not present

## 2017-02-22 DIAGNOSIS — M5441 Lumbago with sciatica, right side: Secondary | ICD-10-CM | POA: Diagnosis not present

## 2017-02-22 DIAGNOSIS — M47816 Spondylosis without myelopathy or radiculopathy, lumbar region: Secondary | ICD-10-CM | POA: Diagnosis not present

## 2017-03-01 DIAGNOSIS — M47816 Spondylosis without myelopathy or radiculopathy, lumbar region: Secondary | ICD-10-CM | POA: Diagnosis not present

## 2017-03-01 DIAGNOSIS — M545 Low back pain: Secondary | ICD-10-CM | POA: Diagnosis not present

## 2017-03-01 DIAGNOSIS — M9903 Segmental and somatic dysfunction of lumbar region: Secondary | ICD-10-CM | POA: Diagnosis not present

## 2017-03-01 DIAGNOSIS — M5441 Lumbago with sciatica, right side: Secondary | ICD-10-CM | POA: Diagnosis not present

## 2017-03-08 DIAGNOSIS — M545 Low back pain: Secondary | ICD-10-CM | POA: Diagnosis not present

## 2017-03-08 DIAGNOSIS — M47816 Spondylosis without myelopathy or radiculopathy, lumbar region: Secondary | ICD-10-CM | POA: Diagnosis not present

## 2017-03-08 DIAGNOSIS — M5441 Lumbago with sciatica, right side: Secondary | ICD-10-CM | POA: Diagnosis not present

## 2017-03-08 DIAGNOSIS — M9903 Segmental and somatic dysfunction of lumbar region: Secondary | ICD-10-CM | POA: Diagnosis not present

## 2017-03-11 DIAGNOSIS — E7849 Other hyperlipidemia: Secondary | ICD-10-CM | POA: Diagnosis not present

## 2017-03-11 DIAGNOSIS — E119 Type 2 diabetes mellitus without complications: Secondary | ICD-10-CM | POA: Diagnosis not present

## 2017-03-11 DIAGNOSIS — I10 Essential (primary) hypertension: Secondary | ICD-10-CM | POA: Diagnosis not present

## 2017-03-22 DIAGNOSIS — M47816 Spondylosis without myelopathy or radiculopathy, lumbar region: Secondary | ICD-10-CM | POA: Diagnosis not present

## 2017-03-22 DIAGNOSIS — M545 Low back pain: Secondary | ICD-10-CM | POA: Diagnosis not present

## 2017-03-22 DIAGNOSIS — M9903 Segmental and somatic dysfunction of lumbar region: Secondary | ICD-10-CM | POA: Diagnosis not present

## 2017-03-22 DIAGNOSIS — M5441 Lumbago with sciatica, right side: Secondary | ICD-10-CM | POA: Diagnosis not present

## 2017-03-29 DIAGNOSIS — M9903 Segmental and somatic dysfunction of lumbar region: Secondary | ICD-10-CM | POA: Diagnosis not present

## 2017-03-29 DIAGNOSIS — M47816 Spondylosis without myelopathy or radiculopathy, lumbar region: Secondary | ICD-10-CM | POA: Diagnosis not present

## 2017-03-29 DIAGNOSIS — M545 Low back pain: Secondary | ICD-10-CM | POA: Diagnosis not present

## 2017-03-29 DIAGNOSIS — M5441 Lumbago with sciatica, right side: Secondary | ICD-10-CM | POA: Diagnosis not present

## 2017-04-01 DIAGNOSIS — E119 Type 2 diabetes mellitus without complications: Secondary | ICD-10-CM | POA: Diagnosis not present

## 2017-04-01 DIAGNOSIS — E7849 Other hyperlipidemia: Secondary | ICD-10-CM | POA: Diagnosis not present

## 2017-04-01 DIAGNOSIS — I10 Essential (primary) hypertension: Secondary | ICD-10-CM | POA: Diagnosis not present

## 2017-04-05 DIAGNOSIS — M545 Low back pain: Secondary | ICD-10-CM | POA: Diagnosis not present

## 2017-04-05 DIAGNOSIS — M47816 Spondylosis without myelopathy or radiculopathy, lumbar region: Secondary | ICD-10-CM | POA: Diagnosis not present

## 2017-04-05 DIAGNOSIS — M9903 Segmental and somatic dysfunction of lumbar region: Secondary | ICD-10-CM | POA: Diagnosis not present

## 2017-04-05 DIAGNOSIS — M5441 Lumbago with sciatica, right side: Secondary | ICD-10-CM | POA: Diagnosis not present

## 2017-04-12 DIAGNOSIS — M47816 Spondylosis without myelopathy or radiculopathy, lumbar region: Secondary | ICD-10-CM | POA: Diagnosis not present

## 2017-04-12 DIAGNOSIS — M545 Low back pain: Secondary | ICD-10-CM | POA: Diagnosis not present

## 2017-04-12 DIAGNOSIS — M5441 Lumbago with sciatica, right side: Secondary | ICD-10-CM | POA: Diagnosis not present

## 2017-04-12 DIAGNOSIS — M9903 Segmental and somatic dysfunction of lumbar region: Secondary | ICD-10-CM | POA: Diagnosis not present

## 2017-04-15 DIAGNOSIS — M9903 Segmental and somatic dysfunction of lumbar region: Secondary | ICD-10-CM | POA: Diagnosis not present

## 2017-04-15 DIAGNOSIS — M545 Low back pain: Secondary | ICD-10-CM | POA: Diagnosis not present

## 2017-04-15 DIAGNOSIS — M5441 Lumbago with sciatica, right side: Secondary | ICD-10-CM | POA: Diagnosis not present

## 2017-04-15 DIAGNOSIS — M47816 Spondylosis without myelopathy or radiculopathy, lumbar region: Secondary | ICD-10-CM | POA: Diagnosis not present

## 2017-04-16 DIAGNOSIS — E668 Other obesity: Secondary | ICD-10-CM | POA: Diagnosis not present

## 2017-04-16 DIAGNOSIS — E119 Type 2 diabetes mellitus without complications: Secondary | ICD-10-CM | POA: Diagnosis not present

## 2017-04-16 DIAGNOSIS — E7849 Other hyperlipidemia: Secondary | ICD-10-CM | POA: Diagnosis not present

## 2017-04-16 DIAGNOSIS — Z6832 Body mass index (BMI) 32.0-32.9, adult: Secondary | ICD-10-CM | POA: Diagnosis not present

## 2017-04-16 DIAGNOSIS — Z6833 Body mass index (BMI) 33.0-33.9, adult: Secondary | ICD-10-CM | POA: Diagnosis not present

## 2017-04-16 DIAGNOSIS — I10 Essential (primary) hypertension: Secondary | ICD-10-CM | POA: Diagnosis not present

## 2017-04-18 DIAGNOSIS — I10 Essential (primary) hypertension: Secondary | ICD-10-CM | POA: Diagnosis not present

## 2017-04-18 DIAGNOSIS — E119 Type 2 diabetes mellitus without complications: Secondary | ICD-10-CM | POA: Diagnosis not present

## 2017-04-18 DIAGNOSIS — E7849 Other hyperlipidemia: Secondary | ICD-10-CM | POA: Diagnosis not present

## 2017-05-15 DIAGNOSIS — I10 Essential (primary) hypertension: Secondary | ICD-10-CM | POA: Diagnosis not present

## 2017-05-15 DIAGNOSIS — E119 Type 2 diabetes mellitus without complications: Secondary | ICD-10-CM | POA: Diagnosis not present

## 2017-05-15 DIAGNOSIS — E7849 Other hyperlipidemia: Secondary | ICD-10-CM | POA: Diagnosis not present

## 2017-06-13 DIAGNOSIS — I10 Essential (primary) hypertension: Secondary | ICD-10-CM | POA: Diagnosis not present

## 2017-06-13 DIAGNOSIS — E7849 Other hyperlipidemia: Secondary | ICD-10-CM | POA: Diagnosis not present

## 2017-06-13 DIAGNOSIS — E119 Type 2 diabetes mellitus without complications: Secondary | ICD-10-CM | POA: Diagnosis not present

## 2017-07-18 DIAGNOSIS — E119 Type 2 diabetes mellitus without complications: Secondary | ICD-10-CM | POA: Diagnosis not present

## 2017-07-18 DIAGNOSIS — Z6833 Body mass index (BMI) 33.0-33.9, adult: Secondary | ICD-10-CM | POA: Diagnosis not present

## 2017-07-18 DIAGNOSIS — E7849 Other hyperlipidemia: Secondary | ICD-10-CM | POA: Diagnosis not present

## 2017-07-18 DIAGNOSIS — E668 Other obesity: Secondary | ICD-10-CM | POA: Diagnosis not present

## 2017-07-18 DIAGNOSIS — I10 Essential (primary) hypertension: Secondary | ICD-10-CM | POA: Diagnosis not present

## 2017-07-26 ENCOUNTER — Other Ambulatory Visit: Payer: Self-pay | Admitting: Pharmacist

## 2017-07-26 NOTE — Patient Outreach (Addendum)
[facility]) Care Management  07/26/2017  Joshua Perez 1950-05-26 [id number]   Call to follow up with Dr. Sherrie Sport regarding patient's simvastatin, to verify that provider intended for patient to discontinue this medication and discuss value of this patient with diabetes continuing to be on a statin unless patient has contraindications. Speak with Abigail Butts in Dr. Joselyn Arrow office, who reports that Dr. Sherrie Sport is not in the office on Fridays. Reviews Dr. Joselyn Arrow office note for the patient and states that the simvastatin has been removed from his medication list.   Abigail Butts reports that she recalls that when Joshua Perez came in for his last appointment that he was telling her that he had been having some falls. Abigail Butts reports that Dr. Joselyn Arrow note also indicates that the provider was concerned about the patient having recent falls and that patient was started on the carbidopa- levodopa to help with the falls/potential Parkinsons. Note that per conversation with Abigail Butts, Joshua Perez is currently prescribed amitriptyline.  Request that, in addition to asking Dr. Sherrie Sport about the patient's simvastatin, that she also ask provider to reevaluate patient's need to be continued on amitriptyline given patient's recent falls and the inclusion of amitriptyline on the [place] List for medications potentially inappropriate for use in the elderly due to it's potential to increase patients' risk for falls.  Abigail Butts reports that she will ask Dr. Sherrie Sport these questions about the simvastatin and amitriptyline and call me back on Monday.  PLAN  If have not heard back from Wendy/Dr. Sherrie Sport by 07/31/17, will call to follow up again at that time.  Harlow Asa, PharmD, [place] [facility] [phone]

## 2017-07-26 NOTE — Patient Outreach (Signed)
[facility]) Care Management  07/26/2017  Joshua Perez [birth year] [id number]   Incoming call from [name] in response to the [facility] Medication Adherence Campaign. Speak with patient. HIPAA identifiers verified and verbal consent received.  [name] reports that he is taking his benazepril 10 mg once daily as directed. Patient denies any missed doses or barriers to adherence. Reports that he has just stopped taking simvastatin. Patient reports that he was instructed to stop the simvastatin 10 days ago by his PCP. Per patient, he was instructed to stop it because Dr. Sherrie Sport suspects that he has Parkinson Disease and wanted him to stop the simvastatin, start a new prescription for carbidopa-levodopa and call the office to let Dr. Sherrie Sport know how it's going in 15 days. [name] reports that he has been taking the medication as directed for these past 10 days and that it has been going well. Reports that he has had an improvement in his muscle symptoms. Denies any side effects to this medication. States that he will call his PCP as directed in a couple of days to let him know as well.  [name] denies any further medication questions/concerns at this time. Provide patient with my phone number.  PLAN  1) [name] to call to follow up with his PCP as directed 15 days after starting the carbidopa-levodopa.  2) I will call to follow up with Dr. Sherrie Sport regarding patient's simvastatin. Will verify that provider intended for patient to discontinue this medication and discuss value of this patient with diabetes continuing to be on a statin unless patient has contraindications.  Harlow Asa, PharmD, [place] [facility] [phone]

## 2017-07-31 ENCOUNTER — Other Ambulatory Visit: Payer: Self-pay | Admitting: Pharmacist

## 2017-07-31 NOTE — Patient Outreach (Signed)
[facility]) Care Management  07/31/2017  DAWSEN KRIEGER Dec 03, 1950 [id number]   Call to follow up with Dr. Sherrie Sport regarding my message for the provider from 07/26/17 both regarding the patient's simvastatin and his amitriptyline.   Speak with Amy in Dr. Joselyn Arrow office who reports that the provider is aware of the potential increase in patients' risk for falls with amitriptyline and reports that despite that patient's requests to increase his amitriptyline dose, the provider has kept the patient at the dose of amitriptyline 20 mg daily. Reports that Dr. Sherrie Sport will not discontinue this medication at this time.  Discuss with Amy the value of this patient with diabetes continuing to be on a statin unless patient has contraindications. Amy states that she will speak with Dr. Sherrie Sport regarding patient's statin therapy for reduction of cardiovascular risk.  PLAN  If have not heard back from Dr. Joselyn Arrow office regarding patient's statin therapy by 08/02/17, will follow up again at that time.  Harlow Asa, PharmD, [place] [facility] [phone]

## 2017-08-01 DIAGNOSIS — E119 Type 2 diabetes mellitus without complications: Secondary | ICD-10-CM | POA: Diagnosis not present

## 2017-08-01 DIAGNOSIS — I10 Essential (primary) hypertension: Secondary | ICD-10-CM | POA: Diagnosis not present

## 2017-08-01 DIAGNOSIS — E7849 Other hyperlipidemia: Secondary | ICD-10-CM | POA: Diagnosis not present

## 2017-08-02 ENCOUNTER — Other Ambulatory Visit: Payer: Self-pay | Admitting: Pharmacist

## 2017-08-02 NOTE — Patient Outreach (Signed)
[facility]) Care Management  08/02/2017  Joshua Perez 1950-12-12 [id number]   Call to follow up with Dr. Joselyn Arrow office regarding patient's statin therapy. Speak with Amy again. Amy reports that per Dr. Sherrie Sport, the provider stopped the patient's simvastatin at his last office visit due to symptoms of muscle pain that the patient had reported. States that the patient is next scheduled to be seen in the office in September and that the provider will discuss with patient restarting the simvastatin at that time.  PLAN  Will close pharmacy episode at this time.   Harlow Asa, PharmD, [place] [facility] [phone]

## 2017-08-27 DIAGNOSIS — I10 Essential (primary) hypertension: Secondary | ICD-10-CM | POA: Diagnosis not present

## 2017-08-27 DIAGNOSIS — W0110XA Fall on same level from slipping, tripping and stumbling with subsequent striking against unspecified object, initial encounter: Secondary | ICD-10-CM | POA: Diagnosis not present

## 2017-08-27 DIAGNOSIS — E039 Hypothyroidism, unspecified: Secondary | ICD-10-CM | POA: Diagnosis not present

## 2017-08-27 DIAGNOSIS — G20 Parkinson's disease: Secondary | ICD-10-CM | POA: Diagnosis not present

## 2017-08-27 DIAGNOSIS — F419 Anxiety disorder, unspecified: Secondary | ICD-10-CM | POA: Diagnosis not present

## 2017-08-27 DIAGNOSIS — R51 Headache: Secondary | ICD-10-CM | POA: Diagnosis not present

## 2017-08-27 DIAGNOSIS — M1990 Unspecified osteoarthritis, unspecified site: Secondary | ICD-10-CM | POA: Diagnosis not present

## 2017-08-27 DIAGNOSIS — E119 Type 2 diabetes mellitus without complications: Secondary | ICD-10-CM | POA: Diagnosis not present

## 2017-08-27 DIAGNOSIS — S0093XA Contusion of unspecified part of head, initial encounter: Secondary | ICD-10-CM | POA: Diagnosis not present

## 2017-08-27 DIAGNOSIS — S0003XA Contusion of scalp, initial encounter: Secondary | ICD-10-CM | POA: Diagnosis not present

## 2017-08-27 DIAGNOSIS — F329 Major depressive disorder, single episode, unspecified: Secondary | ICD-10-CM | POA: Diagnosis not present

## 2017-08-27 DIAGNOSIS — Z880 Allergy status to penicillin: Secondary | ICD-10-CM | POA: Diagnosis not present

## 2017-08-27 DIAGNOSIS — Z6832 Body mass index (BMI) 32.0-32.9, adult: Secondary | ICD-10-CM | POA: Diagnosis not present

## 2017-08-28 DIAGNOSIS — E1143 Type 2 diabetes mellitus with diabetic autonomic (poly)neuropathy: Secondary | ICD-10-CM | POA: Diagnosis not present

## 2017-08-28 DIAGNOSIS — I10 Essential (primary) hypertension: Secondary | ICD-10-CM | POA: Diagnosis not present

## 2017-10-23 DIAGNOSIS — Z1389 Encounter for screening for other disorder: Secondary | ICD-10-CM | POA: Diagnosis not present

## 2017-10-23 DIAGNOSIS — Z0000 Encounter for general adult medical examination without abnormal findings: Secondary | ICD-10-CM | POA: Diagnosis not present

## 2017-10-23 DIAGNOSIS — Z6831 Body mass index (BMI) 31.0-31.9, adult: Secondary | ICD-10-CM | POA: Diagnosis not present

## 2017-10-23 DIAGNOSIS — E1165 Type 2 diabetes mellitus with hyperglycemia: Secondary | ICD-10-CM | POA: Diagnosis not present

## 2017-10-23 DIAGNOSIS — E7849 Other hyperlipidemia: Secondary | ICD-10-CM | POA: Diagnosis not present

## 2017-10-23 DIAGNOSIS — I10 Essential (primary) hypertension: Secondary | ICD-10-CM | POA: Diagnosis not present

## 2017-11-06 DIAGNOSIS — E7849 Other hyperlipidemia: Secondary | ICD-10-CM | POA: Diagnosis not present

## 2017-11-06 DIAGNOSIS — E1165 Type 2 diabetes mellitus with hyperglycemia: Secondary | ICD-10-CM | POA: Diagnosis not present

## 2017-11-06 DIAGNOSIS — I10 Essential (primary) hypertension: Secondary | ICD-10-CM | POA: Diagnosis not present

## 2017-12-30 DIAGNOSIS — E7849 Other hyperlipidemia: Secondary | ICD-10-CM | POA: Diagnosis not present

## 2017-12-30 DIAGNOSIS — I10 Essential (primary) hypertension: Secondary | ICD-10-CM | POA: Diagnosis not present

## 2017-12-30 DIAGNOSIS — E1165 Type 2 diabetes mellitus with hyperglycemia: Secondary | ICD-10-CM | POA: Diagnosis not present

## 2018-01-22 DIAGNOSIS — E119 Type 2 diabetes mellitus without complications: Secondary | ICD-10-CM | POA: Diagnosis not present

## 2018-01-28 DIAGNOSIS — Z6831 Body mass index (BMI) 31.0-31.9, adult: Secondary | ICD-10-CM | POA: Diagnosis not present

## 2018-01-28 DIAGNOSIS — E7849 Other hyperlipidemia: Secondary | ICD-10-CM | POA: Diagnosis not present

## 2018-01-28 DIAGNOSIS — E1165 Type 2 diabetes mellitus with hyperglycemia: Secondary | ICD-10-CM | POA: Diagnosis not present

## 2018-01-28 DIAGNOSIS — Z125 Encounter for screening for malignant neoplasm of prostate: Secondary | ICD-10-CM | POA: Diagnosis not present

## 2018-01-28 DIAGNOSIS — I10 Essential (primary) hypertension: Secondary | ICD-10-CM | POA: Diagnosis not present

## 2018-03-11 DIAGNOSIS — E7849 Other hyperlipidemia: Secondary | ICD-10-CM | POA: Diagnosis not present

## 2018-03-11 DIAGNOSIS — E1165 Type 2 diabetes mellitus with hyperglycemia: Secondary | ICD-10-CM | POA: Diagnosis not present

## 2018-03-11 DIAGNOSIS — I10 Essential (primary) hypertension: Secondary | ICD-10-CM | POA: Diagnosis not present

## 2018-04-25 DIAGNOSIS — E1165 Type 2 diabetes mellitus with hyperglycemia: Secondary | ICD-10-CM | POA: Diagnosis not present

## 2018-04-25 DIAGNOSIS — I10 Essential (primary) hypertension: Secondary | ICD-10-CM | POA: Diagnosis not present

## 2018-04-25 DIAGNOSIS — E7849 Other hyperlipidemia: Secondary | ICD-10-CM | POA: Diagnosis not present

## 2018-06-04 DIAGNOSIS — I10 Essential (primary) hypertension: Secondary | ICD-10-CM | POA: Diagnosis not present

## 2018-06-04 DIAGNOSIS — E7849 Other hyperlipidemia: Secondary | ICD-10-CM | POA: Diagnosis not present

## 2018-06-04 DIAGNOSIS — Z0000 Encounter for general adult medical examination without abnormal findings: Secondary | ICD-10-CM | POA: Diagnosis not present

## 2018-06-04 DIAGNOSIS — E1165 Type 2 diabetes mellitus with hyperglycemia: Secondary | ICD-10-CM | POA: Diagnosis not present

## 2018-06-04 DIAGNOSIS — Z6831 Body mass index (BMI) 31.0-31.9, adult: Secondary | ICD-10-CM | POA: Diagnosis not present

## 2018-06-11 DIAGNOSIS — I10 Essential (primary) hypertension: Secondary | ICD-10-CM | POA: Diagnosis not present

## 2018-06-11 DIAGNOSIS — E7849 Other hyperlipidemia: Secondary | ICD-10-CM | POA: Diagnosis not present

## 2018-06-11 DIAGNOSIS — E1165 Type 2 diabetes mellitus with hyperglycemia: Secondary | ICD-10-CM | POA: Diagnosis not present

## 2018-07-15 DIAGNOSIS — I10 Essential (primary) hypertension: Secondary | ICD-10-CM | POA: Diagnosis not present

## 2018-07-15 DIAGNOSIS — E7849 Other hyperlipidemia: Secondary | ICD-10-CM | POA: Diagnosis not present

## 2018-07-15 DIAGNOSIS — E1165 Type 2 diabetes mellitus with hyperglycemia: Secondary | ICD-10-CM | POA: Diagnosis not present

## 2018-08-07 DIAGNOSIS — I10 Essential (primary) hypertension: Secondary | ICD-10-CM | POA: Diagnosis not present

## 2018-08-07 DIAGNOSIS — E1165 Type 2 diabetes mellitus with hyperglycemia: Secondary | ICD-10-CM | POA: Diagnosis not present

## 2018-08-07 DIAGNOSIS — E7849 Other hyperlipidemia: Secondary | ICD-10-CM | POA: Diagnosis not present

## 2018-08-07 DIAGNOSIS — Z6831 Body mass index (BMI) 31.0-31.9, adult: Secondary | ICD-10-CM | POA: Diagnosis not present

## 2018-11-06 DIAGNOSIS — Z6830 Body mass index (BMI) 30.0-30.9, adult: Secondary | ICD-10-CM | POA: Diagnosis not present

## 2018-11-06 DIAGNOSIS — I10 Essential (primary) hypertension: Secondary | ICD-10-CM | POA: Diagnosis not present

## 2018-11-06 DIAGNOSIS — E1165 Type 2 diabetes mellitus with hyperglycemia: Secondary | ICD-10-CM | POA: Diagnosis not present

## 2018-11-06 DIAGNOSIS — Z0000 Encounter for general adult medical examination without abnormal findings: Secondary | ICD-10-CM | POA: Diagnosis not present

## 2018-11-06 DIAGNOSIS — Z1329 Encounter for screening for other suspected endocrine disorder: Secondary | ICD-10-CM | POA: Diagnosis not present

## 2018-11-06 DIAGNOSIS — Z1389 Encounter for screening for other disorder: Secondary | ICD-10-CM | POA: Diagnosis not present

## 2018-11-06 DIAGNOSIS — E782 Mixed hyperlipidemia: Secondary | ICD-10-CM | POA: Diagnosis not present

## 2018-11-10 DIAGNOSIS — I10 Essential (primary) hypertension: Secondary | ICD-10-CM | POA: Diagnosis not present

## 2018-11-10 DIAGNOSIS — Z9181 History of falling: Secondary | ICD-10-CM | POA: Diagnosis not present

## 2018-11-10 DIAGNOSIS — Z7984 Long term (current) use of oral hypoglycemic drugs: Secondary | ICD-10-CM | POA: Diagnosis not present

## 2018-11-10 DIAGNOSIS — G20 Parkinson's disease: Secondary | ICD-10-CM | POA: Diagnosis not present

## 2018-11-10 DIAGNOSIS — N400 Enlarged prostate without lower urinary tract symptoms: Secondary | ICD-10-CM | POA: Diagnosis not present

## 2018-11-10 DIAGNOSIS — E782 Mixed hyperlipidemia: Secondary | ICD-10-CM | POA: Diagnosis not present

## 2018-11-10 DIAGNOSIS — F329 Major depressive disorder, single episode, unspecified: Secondary | ICD-10-CM | POA: Diagnosis not present

## 2018-11-10 DIAGNOSIS — E1165 Type 2 diabetes mellitus with hyperglycemia: Secondary | ICD-10-CM | POA: Diagnosis not present

## 2018-11-10 DIAGNOSIS — Z7982 Long term (current) use of aspirin: Secondary | ICD-10-CM | POA: Diagnosis not present

## 2018-11-13 DIAGNOSIS — G20 Parkinson's disease: Secondary | ICD-10-CM | POA: Diagnosis not present

## 2018-11-13 DIAGNOSIS — I10 Essential (primary) hypertension: Secondary | ICD-10-CM | POA: Diagnosis not present

## 2018-11-13 DIAGNOSIS — Z7984 Long term (current) use of oral hypoglycemic drugs: Secondary | ICD-10-CM | POA: Diagnosis not present

## 2018-11-13 DIAGNOSIS — Z9181 History of falling: Secondary | ICD-10-CM | POA: Diagnosis not present

## 2018-11-13 DIAGNOSIS — Z7982 Long term (current) use of aspirin: Secondary | ICD-10-CM | POA: Diagnosis not present

## 2018-11-13 DIAGNOSIS — F329 Major depressive disorder, single episode, unspecified: Secondary | ICD-10-CM | POA: Diagnosis not present

## 2018-11-13 DIAGNOSIS — E1165 Type 2 diabetes mellitus with hyperglycemia: Secondary | ICD-10-CM | POA: Diagnosis not present

## 2018-11-13 DIAGNOSIS — N400 Enlarged prostate without lower urinary tract symptoms: Secondary | ICD-10-CM | POA: Diagnosis not present

## 2018-11-13 DIAGNOSIS — E782 Mixed hyperlipidemia: Secondary | ICD-10-CM | POA: Diagnosis not present

## 2018-11-17 DIAGNOSIS — E1165 Type 2 diabetes mellitus with hyperglycemia: Secondary | ICD-10-CM | POA: Diagnosis not present

## 2018-11-17 DIAGNOSIS — Z7984 Long term (current) use of oral hypoglycemic drugs: Secondary | ICD-10-CM | POA: Diagnosis not present

## 2018-11-17 DIAGNOSIS — I10 Essential (primary) hypertension: Secondary | ICD-10-CM | POA: Diagnosis not present

## 2018-11-17 DIAGNOSIS — N400 Enlarged prostate without lower urinary tract symptoms: Secondary | ICD-10-CM | POA: Diagnosis not present

## 2018-11-17 DIAGNOSIS — Z7982 Long term (current) use of aspirin: Secondary | ICD-10-CM | POA: Diagnosis not present

## 2018-11-17 DIAGNOSIS — G20 Parkinson's disease: Secondary | ICD-10-CM | POA: Diagnosis not present

## 2018-11-17 DIAGNOSIS — E782 Mixed hyperlipidemia: Secondary | ICD-10-CM | POA: Diagnosis not present

## 2018-11-17 DIAGNOSIS — F329 Major depressive disorder, single episode, unspecified: Secondary | ICD-10-CM | POA: Diagnosis not present

## 2018-11-17 DIAGNOSIS — Z9181 History of falling: Secondary | ICD-10-CM | POA: Diagnosis not present

## 2018-11-19 DIAGNOSIS — Z7984 Long term (current) use of oral hypoglycemic drugs: Secondary | ICD-10-CM | POA: Diagnosis not present

## 2018-11-19 DIAGNOSIS — F329 Major depressive disorder, single episode, unspecified: Secondary | ICD-10-CM | POA: Diagnosis not present

## 2018-11-19 DIAGNOSIS — G20 Parkinson's disease: Secondary | ICD-10-CM | POA: Diagnosis not present

## 2018-11-19 DIAGNOSIS — Z7982 Long term (current) use of aspirin: Secondary | ICD-10-CM | POA: Diagnosis not present

## 2018-11-19 DIAGNOSIS — Z9181 History of falling: Secondary | ICD-10-CM | POA: Diagnosis not present

## 2018-11-19 DIAGNOSIS — E1165 Type 2 diabetes mellitus with hyperglycemia: Secondary | ICD-10-CM | POA: Diagnosis not present

## 2018-11-19 DIAGNOSIS — E782 Mixed hyperlipidemia: Secondary | ICD-10-CM | POA: Diagnosis not present

## 2018-11-19 DIAGNOSIS — N400 Enlarged prostate without lower urinary tract symptoms: Secondary | ICD-10-CM | POA: Diagnosis not present

## 2018-11-19 DIAGNOSIS — I10 Essential (primary) hypertension: Secondary | ICD-10-CM | POA: Diagnosis not present

## 2018-11-24 DIAGNOSIS — E782 Mixed hyperlipidemia: Secondary | ICD-10-CM | POA: Diagnosis not present

## 2018-11-24 DIAGNOSIS — E1165 Type 2 diabetes mellitus with hyperglycemia: Secondary | ICD-10-CM | POA: Diagnosis not present

## 2018-11-24 DIAGNOSIS — Z7982 Long term (current) use of aspirin: Secondary | ICD-10-CM | POA: Diagnosis not present

## 2018-11-24 DIAGNOSIS — Z7984 Long term (current) use of oral hypoglycemic drugs: Secondary | ICD-10-CM | POA: Diagnosis not present

## 2018-11-24 DIAGNOSIS — G20 Parkinson's disease: Secondary | ICD-10-CM | POA: Diagnosis not present

## 2018-11-24 DIAGNOSIS — I10 Essential (primary) hypertension: Secondary | ICD-10-CM | POA: Diagnosis not present

## 2018-11-24 DIAGNOSIS — F329 Major depressive disorder, single episode, unspecified: Secondary | ICD-10-CM | POA: Diagnosis not present

## 2018-11-24 DIAGNOSIS — N400 Enlarged prostate without lower urinary tract symptoms: Secondary | ICD-10-CM | POA: Diagnosis not present

## 2018-11-24 DIAGNOSIS — Z9181 History of falling: Secondary | ICD-10-CM | POA: Diagnosis not present

## 2018-11-26 DIAGNOSIS — Z9181 History of falling: Secondary | ICD-10-CM | POA: Diagnosis not present

## 2018-11-26 DIAGNOSIS — I10 Essential (primary) hypertension: Secondary | ICD-10-CM | POA: Diagnosis not present

## 2018-11-26 DIAGNOSIS — G20 Parkinson's disease: Secondary | ICD-10-CM | POA: Diagnosis not present

## 2018-11-26 DIAGNOSIS — N400 Enlarged prostate without lower urinary tract symptoms: Secondary | ICD-10-CM | POA: Diagnosis not present

## 2018-11-26 DIAGNOSIS — Z7984 Long term (current) use of oral hypoglycemic drugs: Secondary | ICD-10-CM | POA: Diagnosis not present

## 2018-11-26 DIAGNOSIS — Z7982 Long term (current) use of aspirin: Secondary | ICD-10-CM | POA: Diagnosis not present

## 2018-11-26 DIAGNOSIS — E782 Mixed hyperlipidemia: Secondary | ICD-10-CM | POA: Diagnosis not present

## 2018-11-26 DIAGNOSIS — E1165 Type 2 diabetes mellitus with hyperglycemia: Secondary | ICD-10-CM | POA: Diagnosis not present

## 2018-11-26 DIAGNOSIS — F329 Major depressive disorder, single episode, unspecified: Secondary | ICD-10-CM | POA: Diagnosis not present

## 2018-11-27 DIAGNOSIS — I10 Essential (primary) hypertension: Secondary | ICD-10-CM | POA: Diagnosis not present

## 2018-11-27 DIAGNOSIS — H539 Unspecified visual disturbance: Secondary | ICD-10-CM | POA: Diagnosis not present

## 2018-11-27 DIAGNOSIS — H518 Other specified disorders of binocular movement: Secondary | ICD-10-CM | POA: Diagnosis not present

## 2019-02-02 DIAGNOSIS — I10 Essential (primary) hypertension: Secondary | ICD-10-CM | POA: Diagnosis not present

## 2019-02-02 DIAGNOSIS — E1165 Type 2 diabetes mellitus with hyperglycemia: Secondary | ICD-10-CM | POA: Diagnosis not present

## 2019-02-02 DIAGNOSIS — E782 Mixed hyperlipidemia: Secondary | ICD-10-CM | POA: Diagnosis not present

## 2019-02-02 DIAGNOSIS — Z6831 Body mass index (BMI) 31.0-31.9, adult: Secondary | ICD-10-CM | POA: Diagnosis not present

## 2019-02-11 DIAGNOSIS — E119 Type 2 diabetes mellitus without complications: Secondary | ICD-10-CM | POA: Diagnosis not present

## 2019-02-11 DIAGNOSIS — G20 Parkinson's disease: Secondary | ICD-10-CM | POA: Diagnosis not present

## 2019-02-12 DIAGNOSIS — E782 Mixed hyperlipidemia: Secondary | ICD-10-CM | POA: Diagnosis not present

## 2019-02-12 DIAGNOSIS — Z79899 Other long term (current) drug therapy: Secondary | ICD-10-CM | POA: Diagnosis not present

## 2019-02-12 DIAGNOSIS — E1165 Type 2 diabetes mellitus with hyperglycemia: Secondary | ICD-10-CM | POA: Diagnosis not present

## 2019-02-12 DIAGNOSIS — I10 Essential (primary) hypertension: Secondary | ICD-10-CM | POA: Diagnosis not present

## 2019-02-16 DIAGNOSIS — Z20828 Contact with and (suspected) exposure to other viral communicable diseases: Secondary | ICD-10-CM | POA: Diagnosis not present

## 2019-04-14 ENCOUNTER — Ambulatory Visit: Payer: Self-pay | Admitting: Urology

## 2019-04-20 DIAGNOSIS — Z23 Encounter for immunization: Secondary | ICD-10-CM | POA: Diagnosis not present

## 2019-05-04 DIAGNOSIS — E782 Mixed hyperlipidemia: Secondary | ICD-10-CM | POA: Diagnosis not present

## 2019-05-04 DIAGNOSIS — F5101 Primary insomnia: Secondary | ICD-10-CM | POA: Diagnosis not present

## 2019-05-04 DIAGNOSIS — Z23 Encounter for immunization: Secondary | ICD-10-CM | POA: Diagnosis not present

## 2019-05-04 DIAGNOSIS — R55 Syncope and collapse: Secondary | ICD-10-CM | POA: Diagnosis not present

## 2019-05-04 DIAGNOSIS — Z6831 Body mass index (BMI) 31.0-31.9, adult: Secondary | ICD-10-CM | POA: Diagnosis not present

## 2019-05-04 DIAGNOSIS — E1165 Type 2 diabetes mellitus with hyperglycemia: Secondary | ICD-10-CM | POA: Diagnosis not present

## 2019-05-04 DIAGNOSIS — I10 Essential (primary) hypertension: Secondary | ICD-10-CM | POA: Diagnosis not present

## 2019-06-16 ENCOUNTER — Encounter: Payer: Self-pay | Admitting: Urology

## 2019-06-16 ENCOUNTER — Ambulatory Visit: Payer: Medicare HMO | Admitting: Urology

## 2019-06-16 ENCOUNTER — Other Ambulatory Visit: Payer: Self-pay

## 2019-06-16 VITALS — BP 120/66 | HR 109 | Temp 97.1°F | Ht 74.0 in | Wt 240.0 lb

## 2019-06-16 DIAGNOSIS — N401 Enlarged prostate with lower urinary tract symptoms: Secondary | ICD-10-CM

## 2019-06-16 DIAGNOSIS — R350 Frequency of micturition: Secondary | ICD-10-CM | POA: Diagnosis not present

## 2019-06-16 DIAGNOSIS — G20 Parkinson's disease: Secondary | ICD-10-CM

## 2019-06-16 LAB — POCT URINALYSIS DIPSTICK
Bilirubin, UA: NEGATIVE
Blood, UA: NEGATIVE
Glucose, UA: NEGATIVE
Ketones, UA: NEGATIVE
Leukocytes, UA: NEGATIVE
Nitrite, UA: NEGATIVE
Protein, UA: POSITIVE — AB
Spec Grav, UA: 1.015 (ref 1.010–1.025)
Urobilinogen, UA: 0.2 E.U./dL
pH, UA: 6 (ref 5.0–8.0)

## 2019-06-16 NOTE — Progress Notes (Signed)
See H& P.

## 2019-06-16 NOTE — Progress Notes (Signed)
H&P  Chief Complaint: BPH w/ LUTS  History of Present Illness:   5.4.2021: Last seen here in 2013, this man presents today c/o worsening urinary sx's he associates with a possibly enlarged prostate. He c/o high urinary freq, intermittent stream, urgency, weak stream, urge incontinence (wears pads), and a fairly regular sense of incomplete emptying. He has a family hx of BPH (father and grandfather). He has Parkinson's but is not on any medications for this and is not being followed by a neurologist.   IPSS Questionnaire (AUA-7): Over the past month.   1)  How often have you had a sensation of not emptying your bladder completely after you finish urinating?  5 - Almost always  2)  How often have you had to urinate again less than two hours after you finished urinating? 5 - Almost always  3)  How often have you found you stopped and started again several times when you urinated?  5 - Almost always  4) How difficult have you found it to postpone urination?  5 - Almost always  5) How often have you had a weak urinary stream?  3 - About half the time  6) How often have you had to push or strain to begin urination?  0 - Not at all  7) How many times did you most typically get up to urinate from the time you went to bed until the time you got up in the morning?  2 - 2 times  Total score:  0-7 mildly symptomatic   8-19 moderately symptomatic   20-35 severely symptomatic   Total: 25 QoL: 5  Past Medical History:  Diagnosis Date  . Abnormality of gait 09/15/2015  . Anxiety   . Arthritis   . Asthma   . Bipolar disorder ([place]) 1965   Onset in childhood; remote history of ECT  . Depression   . Diabetes mellitus, type 2 ([place]) 1980   Recent hemoglobin A1c reportedly 5.3; controlled with single oral agent  . Diabetic peripheral neuropathy ([place]) 09/15/2015  . Erectile dysfunction   . High cholesterol   . Hypertension 1995  . Hypothyroidism   . Imbalance   . Joint pain   . Palpitations    Echocardiogram normal in 07/2007 except for mild LVH; Event recorder-first degree AV block, no symptomatic spells, no significant arrhythmias; stress nuclear negative in 1996  . Syncope and collapse 09/15/2015    Past Surgical History:  Procedure Laterality Date  . APPENDECTOMY    . BIOPSY  09/21/2011   NOT DONE. Unable to remove from [facility] list  . CHOLECYSTECTOMY    . COLONOSCOPY  09/21/2011   RMR: Colonic polyps-treated/removed as described above/ Colonic diverticulosis. Lax sphincter time. Status post segmental biopsies(TUBULOVILLOUS ADENOMA/TUBULAR ADENOMA). Prep marginal. TCS 3 months recommended  . COLONOSCOPY  01/18/2012   RMR: tubular adenoma. Surveillance due Dec 2016  . COLONOSCOPY W/ POLYPECTOMY  2002   Dr. Norberto Sorenson Stark-->97mm sessile trv colon polyp, path unavailable  . HERNIA REPAIR  1956   , right inguinal  . NASAL SEPTUM SURGERY  1974  . TONSILLECTOMY  1957  . VASECTOMY  1988   ?  Subsequent reversal  . VASECTOMY REVERSAL      Home Medications:  Allergies as of 06/16/2019      Reactions   Bee Venom Anaphylaxis   Penicillins Shortness Of Breath, Swelling, Rash   Augmentin      Medication List       Accurate as of Jun 16, 2019  2:59 PM. If you have any questions, ask your nurse or doctor.        STOP taking these medications   amitriptyline 10 MG tablet Commonly known as: ELAVIL Stopped by: Jorja Loa, MD     TAKE these medications   Accu-Chek Aviva Plus test strip Generic drug: glucose blood   Accu-Chek FastClix Lancets Misc   ALPRAZolam 1 MG tablet Commonly known as: XANAX   aspirin 81 MG tablet Take 81 mg by mouth daily.   atorvastatin 40 MG tablet Commonly known as: LIPITOR   Belsomra 10 MG Tabs Generic drug: Suvorexant   benazepril 10 MG tablet Commonly known as: LOTENSIN Take 10 mg by mouth daily.   calcium carbonate 600 MG tablet Commonly known as: OS-CAL Take 600 mg by mouth daily.   celecoxib 200 MG capsule Commonly known as:  CELEBREX   DULoxetine 60 MG capsule Commonly known as: CYMBALTA What changed: Another medication with the same name was removed. Continue taking this medication, and follow the directions you see here. Changed by: Jorja Loa, MD   EPINEPHrine 0.3 mg/0.3 mL Soaj injection Commonly known as: EpiPen 2-Pak Inject 0.3 mLs (0.3 mg total) into the muscle once.   FISH OIL PO Take 2 capsules by mouth daily.   gabapentin 400 MG capsule Commonly known as: NEURONTIN What changed: Another medication with the same name was removed. Continue taking this medication, and follow the directions you see here. Changed by: Jorja Loa, MD   glipiZIDE 5 MG tablet Commonly known as: GLUCOTROL   GLUCOSAMINE CHONDR 1500 COMPLX PO Take by mouth.   levothyroxine 75 MCG tablet Commonly known as: SYNTHROID Take 1 tablet (75 mcg total) by mouth daily.   Magnesium 400 MG Tabs Take 2 tablets by mouth 2 (two) times daily.   metFORMIN 1000 MG tablet Commonly known as: GLUCOPHAGE What changed: Another medication with the same name was removed. Continue taking this medication, and follow the directions you see here. Changed by: Jorja Loa, MD   metoprolol tartrate 50 MG tablet Commonly known as: LOPRESSOR TAKE ONE TABLET BY MOUTH TWICE DAILY   pramipexole 1 MG tablet Commonly known as: MIRAPEX   vitamin C 100 MG tablet Take 500 mg by mouth daily.   VITAMIN D-3 PO Take 1 capsule by mouth daily.       Allergies:  Allergies  Allergen Reactions  . Bee Venom Anaphylaxis  . Penicillins Shortness Of Breath, Swelling and Rash    Augmentin    Family History  Problem Relation Age of Onset  . Diabetes Father   . Heart attack Father   . Alzheimer's disease Mother   . Diabetes Paternal Grandfather   . Heart disease Paternal Grandfather   . Colon cancer Neg Hx   . Liver disease Neg Hx   . Inflammatory bowel disease Neg Hx     Social History:  reports that he has never  smoked. He has never used smokeless tobacco. He reports that he does not drink alcohol or use drugs.  ROS: Urol:Patient is experiencing the following symptoms: Frequent urination Hard to postpone urination Get up at night to urinate Leakage of urine Stream starts and stops Trouble starting stream Painful intercourse Erection problems (male only) Review of Systems Gastrointestinal (upper)  : Indigestion/heartburn Gastrointestinal (lower) : Negative for lower GI symptoms Constitutional : Fatigue Skin: Negative for skin symptoms Eyes: Negative for eye symptoms Ear/Nose/Throat : Sinus problems Hematologic/Lymphatic: Negative for Hematologic/Lymphatic symptoms Cardiovascular : Negative for  cardiovascular symptoms Respiratory : Shortness of breath Endocrine: Excessive thirst Musculoskeletal: Back pain Joint pain Neurological: Headaches Dizziness Psychologic: Negative for psychiatric symptoms   Physical Exam:  Vital signs in last 24 hours: BP 120/66   Pulse (!) 109   Temp (!) 97.1 F (36.2 C)   Ht 6\' 2"  (1.88 m)   Wt 240 lb (108.9 kg)   BMI 30.81 kg/m  Constitutional:  Alert and oriented, No acute distress Cardiovascular: Regular rate  Respiratory: Normal respiratory effort GI: Abdomen is soft, nontender, nondistended, no abdominal masses. No CVAT. No hernias. Genitourinary: Normal male phallus, testes are descended bilaterally and non-tender and without masses, scrotum is normal in appearance without lesions or masses, perineum is normal on inspection. Prostate feels around 40 grams in size. Lymphatic: No lymphadenopathy Neurologic: Grossly intact, no focal deficits Psychiatric: Normal mood and affect  Laboratory Data:  No results for input(s): WBC, HGB, HCT, PLT in the last 72 hours.  No results for input(s): NA, K, CL, GLUCOSE, BUN, CALCIUM, CREATININE in the last 72 hours.  Invalid input(s): CO3   Results for orders placed or performed in visit on  06/16/19 (from the past 24 hour(s))  POCT urinalysis dipstick     Status: Abnormal   Collection Time: 06/16/19  2:46 PM  Result Value Ref Range   Color, UA yellow    Clarity, UA     Glucose, UA Negative Negative   Bilirubin, UA neg    Ketones, UA neg    Spec Grav, UA 1.015 1.010 - 1.025   Blood, UA neg    pH, UA 6.0 5.0 - 8.0   Protein, UA Positive (A) Negative   Urobilinogen, UA 0.2 0.2 or 1.0 E.U./dL   Nitrite, UA neg    Leukocytes, UA Negative Negative   Appearance hazy    Odor     I have reviewed prior pt notes  I have reviewed notes from referring/previous physicians  I have reviewed urinalysis results  I have independently reviewed prior imaging  I have reviewed prior PSA results  Impression/Assessment:  He does have severe urinary sx's. DRE today normal with a slightly enlarged gland. While his urinary sx's may be secondary to prostatic obstruction, he may also likely have OAB.   Plan:  1. I advised that he find a way to be seen by a neurologist for his currently untreated Parkinson's -- he would like [place] to set up a referral.   2. OAB guide sheet given -- advised to start on non-medical management of his sx's.   3. Return for OV in 3 mo  4. He request a PSA check at today's visit -- will forward results

## 2019-08-04 DIAGNOSIS — Z0000 Encounter for general adult medical examination without abnormal findings: Secondary | ICD-10-CM | POA: Diagnosis not present

## 2019-08-04 DIAGNOSIS — E782 Mixed hyperlipidemia: Secondary | ICD-10-CM | POA: Diagnosis not present

## 2019-08-04 DIAGNOSIS — F5101 Primary insomnia: Secondary | ICD-10-CM | POA: Diagnosis not present

## 2019-08-04 DIAGNOSIS — Z6831 Body mass index (BMI) 31.0-31.9, adult: Secondary | ICD-10-CM | POA: Diagnosis not present

## 2019-08-04 DIAGNOSIS — I10 Essential (primary) hypertension: Secondary | ICD-10-CM | POA: Diagnosis not present

## 2019-08-04 DIAGNOSIS — E1165 Type 2 diabetes mellitus with hyperglycemia: Secondary | ICD-10-CM | POA: Diagnosis not present

## 2019-08-04 DIAGNOSIS — R55 Syncope and collapse: Secondary | ICD-10-CM | POA: Diagnosis not present

## 2019-08-06 ENCOUNTER — Ambulatory Visit: Payer: Medicare HMO | Admitting: Neurology

## 2019-08-06 ENCOUNTER — Encounter: Payer: Self-pay | Admitting: Neurology

## 2019-08-06 VITALS — BP 98/70 | HR 88 | Ht 74.0 in | Wt 242.5 lb

## 2019-08-06 DIAGNOSIS — R269 Unspecified abnormalities of gait and mobility: Secondary | ICD-10-CM | POA: Diagnosis not present

## 2019-08-06 DIAGNOSIS — G3281 Cerebellar ataxia in diseases classified elsewhere: Secondary | ICD-10-CM

## 2019-08-06 DIAGNOSIS — F319 Bipolar disorder, unspecified: Secondary | ICD-10-CM | POA: Diagnosis not present

## 2019-08-06 DIAGNOSIS — E538 Deficiency of other specified B group vitamins: Secondary | ICD-10-CM | POA: Diagnosis not present

## 2019-08-06 NOTE — Progress Notes (Signed)
Reason for visit: Gait disorder  Referring physician: Dr. [name] is a 69 y.o. male  History of present illness:  Joshua Perez is a 69 year old left-handed white male with a history of bipolar disorder.  The patient in the past has been on lithium, he was taken off of it about 4 years ago when he became toxic on the drug.  The patient was seen through this office for episodes of syncope and falling.  According to the wife, the patient is had some trouble with gait instability dating back at least 15 years.  The patient however was on a multitude of medications and was quite drowsy and spaced out on the drugs.  When the patient was seen 4 years ago, he did have gait instability at that time.  The patient has had ongoing walking problems, he has been using a walker for least a year, with using the walker he does not fall, he may fall without it.  His last fall was about 10 days ago.  The patient is shuffling his feet, he has decreased arm swing.  He has been diagnosed with possible Parkinson's disease, he was tried on amantadine and Mirapex, but did not tolerate the medications well.  He does have diabetes and reports some numbness in the feet, his last hemoglobin A1c was 7.6.  The patient does have some underlying fatigue issues.  He has some dizziness with standing up.  He does have some trouble with urinary frequency and difficulty voiding the bladder and is followed through urology.  He is no longer followed through psychiatry, he is on Cymbalta currently.  He is sent to this office for further evaluation.  He does report some mild tremors involving the head and neck and hands.  Past Medical History:  Diagnosis Date  . Abnormality of gait 09/15/2015  . Anxiety   . Arthritis   . Asthma   . Bipolar disorder ([place]) 1965   Onset in childhood; remote history of ECT  . Depression   . Diabetes mellitus, type 2 ([place]) 1980   Recent hemoglobin A1c reportedly 5.3; controlled with  single oral agent  . Diabetic peripheral neuropathy ([place]) 09/15/2015  . Erectile dysfunction   . High cholesterol   . Hypertension 1995  . Hypothyroidism   . Imbalance   . Joint pain   . Palpitations    Echocardiogram normal in 07/2007 except for mild LVH; Event recorder-first degree AV block, no symptomatic spells, no significant arrhythmias; stress nuclear negative in 1996  . Syncope and collapse 09/15/2015    Past Surgical History:  Procedure Laterality Date  . APPENDECTOMY    . BIOPSY  09/21/2011   NOT DONE. Unable to remove from [facility] list  . CHOLECYSTECTOMY    . COLONOSCOPY  09/21/2011   RMR: Colonic polyps-treated/removed as described above/ Colonic diverticulosis. Lax sphincter time. Status post segmental biopsies(TUBULOVILLOUS ADENOMA/TUBULAR ADENOMA). Prep marginal. TCS 3 months recommended  . COLONOSCOPY  01/18/2012   RMR: tubular adenoma. Surveillance due Dec 2016  . COLONOSCOPY W/ POLYPECTOMY  2002   Dr. Norberto Sorenson Stark-->56mm sessile trv colon polyp, path unavailable  . HERNIA REPAIR  1956   , right inguinal  . NASAL SEPTUM SURGERY  1974  . TONSILLECTOMY  1957  . VASECTOMY  1988   ?  Subsequent reversal  . VASECTOMY REVERSAL      Family History  Problem Relation Age of Onset  . Diabetes Father   . Heart attack Father   .  Alzheimer's disease Mother   . Diabetes Paternal Grandfather   . Heart disease Paternal Grandfather   . Colon cancer Neg Hx   . Liver disease Neg Hx   . Inflammatory bowel disease Neg Hx     Social history:  reports that he has never smoked. He has never used smokeless tobacco. He reports that he does not drink alcohol and does not use drugs.  Medications:  Prior to Admission medications   Medication Sig Start Date End Date Taking? Authorizing Provider  ACCU-CHEK AVIVA PLUS test strip  04/11/19  Yes [provider]  Accu-Chek FastClix Lancets [place]  05/15/19  Yes [provider]  ALPRAZolam Duanne Moron) 1 MG tablet  05/14/19  Yes [provider]  Ascorbic Acid (VITAMIN C) 100 MG tablet Take 500 mg by mouth daily.    Yes [provider]  aspirin 81 MG tablet Take 81 mg by mouth daily.     Yes [provider]  atorvastatin (LIPITOR) 40 MG tablet  06/12/19  Yes [provider]  BELSOMRA 10 MG TABS  05/06/19  Yes [provider]  benazepril (LOTENSIN) 10 MG tablet Take 10 mg by mouth daily.   Yes [provider]  DULoxetine (CYMBALTA) 60 MG capsule  05/15/19  Yes [provider]  EPINEPHrine (EPIPEN 2-PAK) 0.3 mg/0.3 mL SOAJ injection Inject 0.3 mLs (0.3 mg total) into the muscle once. 11/03/12  Yes Mikey Kirschner, MD  gabapentin (NEURONTIN) 400 MG capsule  05/15/19  Yes [provider]  glipiZIDE (GLUCOTROL) 5 MG tablet  03/30/19  Yes [provider]  Glucosamine-Chondroit-Vit C-Mn (GLUCOSAMINE CHONDR 1500 COMPLX PO) Take by mouth.   Yes [provider]  levothyroxine (SYNTHROID, LEVOTHROID) 75 MCG tablet Take 1 tablet (75 mcg total) by mouth daily. 04/22/13  Yes Mikey Kirschner, MD  Magnesium 400 MG TABS Take 2 tablets by mouth 2 (two) times daily.  08/13/15  Yes [provider]  metFORMIN (GLUCOPHAGE) 1000 MG tablet Take 1,000 mg by mouth 2 (two) times daily with a meal.   Yes [provider]      Allergies  Allergen Reactions  . Bee Venom Anaphylaxis  . Penicillins Shortness Of Breath, Swelling and Rash    ROS:  Out of a complete 14 system review of symptoms, the patient complains only of the following symptoms, and all other reviewed systems are negative.  Walking difficulty Dizziness Chronic fatigue  Blood pressure 98/70, pulse 88, height 6\' 2"  (1.88 m), weight 242 lb 8 oz (110 kg), SpO2 98 %.  Physical Exam  General: The patient is alert and cooperative at the time of the examination.  Eyes: Pupils are equal, round, and reactive to light. Discs are flat bilaterally.  Neck: The neck is supple, no carotid bruits  are noted.  Respiratory: The respiratory examination is clear.  Cardiovascular: The cardiovascular examination reveals a regular rate and rhythm, no obvious murmurs or rubs are noted.  Skin: Extremities are without significant edema.  Neurologic Exam  Mental status: The patient is alert and oriented x 3 at the time of the examination. The patient has apparent normal recent and remote memory, with an apparently normal attention span and concentration ability.  Cranial nerves: Facial symmetry is present. There is good sensation of the face to pinprick and soft touch bilaterally. The strength of the facial muscles and the muscles to head turning and shoulder shrug are normal bilaterally. Speech is well enunciated, no aphasia or dysarthria is noted.  Extraocular movements are full. Visual fields are full. The tongue is midline, and the patient has symmetric elevation of the soft palate. No obvious hearing deficits are noted.  The patient does not appear to have masking of the face, he has good facial animation, normal eye blink.  Motor: The motor testing reveals 5 over 5 strength of all 4 extremities. Good symmetric motor tone is noted throughout.  Sensory: Sensory testing is intact to pinprick, soft touch, vibration sensation, and position sense on all 4 extremities, with exception of some stocking pattern pinprick sensory deficit the distal third of the legs bilaterally below the knees. No evidence of extinction is noted.  Coordination: Cerebellar testing reveals good finger-nose-finger bilaterally.  The patient has fair heel-to-shin on with the left leg, some dysmetria with the right.  Gait and station: Gait is minimally wide-based.  The patient walks with his arms beside him without arm swing, he tends to shuffle his feet.  Tandem gait is unsteady but he is able to perform this.  Romberg is negative.  Reflexes: Deep tendon reflexes are symmetric and normal bilaterally.  The ankle jerk reflexes  are well-maintained bilaterally.  Toes are downgoing bilaterally.   Assessment/Plan:  1.  Gait disorder  2.  History of bipolar disorder  The patient appears to have some features of parkinsonism with decreased arm swing and shuffling of the gait but he has very good facial animation.  The features are somewhat atypical for true Parkinson's disease.  The patient has had a longstanding history of gait instability, he clearly had gait problems when I saw him 4 years ago.  The patient will be set up for blood work today, he will have MRI of the brain.  He will be sent for DaTscan looking for evidence of Parkinson's disease.  If this scan is positive, we will initiate Sinemet.  The patient does not wish to have another referral to a psychiatry doctor.  He has undergone some physical therapy with some benefit with the gait in November 2020.  He will follow-up here in 4 months.  Jill Alexanders MD 08/06/2019 4:37 PM  [facility] [address] [place], [place] [address]  Phone [phone] Fax [phone]

## 2019-08-08 LAB — VITAMIN B12: Vitamin B-12: 331 pg/mL (ref 232–1245)

## 2019-08-08 LAB — COPPER, SERUM: Copper: 117 ug/dL (ref 69–132)

## 2019-08-08 LAB — RPR: RPR Ser Ql: NONREACTIVE

## 2019-08-10 ENCOUNTER — Telehealth: Payer: Self-pay | Admitting: Neurology

## 2019-08-10 NOTE — Telephone Encounter (Signed)
[name] Kaufmann: [id number] (exp. 08/10/19 to 09/09/19) order sent to GI. They will reach out to the patient to schedule.

## 2019-08-11 NOTE — Telephone Encounter (Signed)
Faxed the order to [facility] they will reach out to the patient to schedule.

## 2019-08-12 NOTE — Telephone Encounter (Signed)
schedule for 08/26/19 at 1:30 pm

## 2019-08-20 ENCOUNTER — Telehealth: Payer: Self-pay | Admitting: Neurology

## 2019-08-20 NOTE — Telephone Encounter (Signed)
Called and spoke to patient's wife and Relayed to Her that I would have to mail her consent for DAT scan . Patient's wife will mail back to me.

## 2019-08-26 DIAGNOSIS — R296 Repeated falls: Secondary | ICD-10-CM | POA: Diagnosis not present

## 2019-08-26 DIAGNOSIS — R269 Unspecified abnormalities of gait and mobility: Secondary | ICD-10-CM | POA: Diagnosis not present

## 2019-08-31 ENCOUNTER — Telehealth: Payer: Self-pay | Admitting: Neurology

## 2019-08-31 NOTE — Telephone Encounter (Signed)
MRI brain report done at [place] appears to be unremarkable.  I called and I talked with the wife.  The DaTscan is pending.  MRI brain 08/26/19:  Impression:  No acute intracranial abnormality and essentially normal for age noncontrast MRI appearance of the brain.

## 2019-09-15 ENCOUNTER — Other Ambulatory Visit: Payer: Self-pay

## 2019-09-15 ENCOUNTER — Encounter: Payer: Self-pay | Admitting: Urology

## 2019-09-15 ENCOUNTER — Ambulatory Visit (INDEPENDENT_AMBULATORY_CARE_PROVIDER_SITE_OTHER): Payer: Medicare HMO | Admitting: Urology

## 2019-09-15 VITALS — BP 103/65 | HR 108 | Temp 98.1°F | Ht 74.0 in | Wt 242.5 lb

## 2019-09-15 DIAGNOSIS — R350 Frequency of micturition: Secondary | ICD-10-CM

## 2019-09-15 DIAGNOSIS — R3915 Urgency of urination: Secondary | ICD-10-CM | POA: Diagnosis not present

## 2019-09-15 DIAGNOSIS — N401 Enlarged prostate with lower urinary tract symptoms: Secondary | ICD-10-CM

## 2019-09-15 LAB — BLADDER SCAN AMB NON-IMAGING: Scan Result: 7

## 2019-09-15 NOTE — Progress Notes (Signed)
Urological Symptom Review  Patient is experiencing the following symptoms: Frequent urination Hard to postpone urination Get up at night to urinate Leakage of urine Stream starts and stops Erection problems (male only)   Review of Systems  Gastrointestinal (upper)  : Negative for upper GI symptoms  Gastrointestinal (lower) : Negative for lower GI symptoms  Constitutional : Night Sweats Fatigue  Skin: Itching  Eyes: Negative for eye symptoms  Ear/Nose/Throat : Sinus problems  Hematologic/Lymphatic: Negative for Hematologic/Lymphatic symptoms  Cardiovascular : Negative for cardiovascular symptoms  Respiratory : Shortness of breath  Endocrine: Excessive thirst  Musculoskeletal: Back pain Joint pain  Neurological: Negative for neurological symptoms  Psychologic: Negative for psychiatric symptoms

## 2019-09-15 NOTE — Progress Notes (Signed)
H&P  Chief Complaint: BPH/OAB  History of Present Illness:  8.3.2021: Pt c/o urinary intermittency, frequency, and incomplete emptying. Pt submits that following the advice on the OAB sheet has produced improvement in his symptoms and is currently satisfied to continue with these self-help measures. Pt reports a diminished FOS such that he is unable to stand away from the commode during urination.   IPSS Questionnaire (AUA-7): Over the past month.   1)  How often have you had a sensation of not emptying your bladder completely after you finish urinating?  5 - Almost always  2)  How often have you had to urinate again less than two hours after you finished urinating? 3 - About half the time  3)  How often have you found you stopped and started again several times when you urinated?    4) How difficult have you found it to postpone urination?  5 - Almost always  5) How often have you had a weak urinary stream?  3 - About half the time  6) How often have you had to push or strain to begin urination?  1 - Less than 1 time in 5  7) How many times did you most typically get up to urinate from the time you went to bed until the time you got up in the morning?  2 - 2 times  Total score:  0-7 mildly symptomatic   8-19 moderately symptomatic   20-35 severely symptomatic  QOL score: 5   (below copied from [place] records):  5.4.2021: Last seen here in 2013, this man presents today c/o worsening urinary sx's he associates with a possibly enlarged prostate. He c/o high urinary freq, intermittent stream, urgency, weak stream, urge incontinence (wears pads), and a fairly regular sense of incomplete emptying. He has a family hx of BPH (father and grandfather). He has Parkinson's but is not on any medications for this and is not being followed by a neurologist. Scheduled for Neuro consult, sent home w/ OAB guide sheet.  Past Medical History:  Diagnosis Date  . Abnormality of gait 09/15/2015  . Anxiety   .  Arthritis   . Asthma   . Bipolar disorder ([place]) 1965   Onset in childhood; remote history of ECT  . Depression   . Diabetes mellitus, type 2 ([place]) 1980   Recent hemoglobin A1c reportedly 5.3; controlled with single oral agent  . Diabetic peripheral neuropathy ([place]) 09/15/2015  . Erectile dysfunction   . High cholesterol   . Hypertension 1995  . Hypothyroidism   . Imbalance   . Joint pain   . Palpitations    Echocardiogram normal in 07/2007 except for mild LVH; Event recorder-first degree AV block, no symptomatic spells, no significant arrhythmias; stress nuclear negative in 1996  . Syncope and collapse 09/15/2015    Past Surgical History:  Procedure Laterality Date  . APPENDECTOMY    . BIOPSY  09/21/2011   NOT DONE. Unable to remove from [facility] list  . CHOLECYSTECTOMY    . COLONOSCOPY  09/21/2011   RMR: Colonic polyps-treated/removed as described above/ Colonic diverticulosis. Lax sphincter time. Status post segmental biopsies(TUBULOVILLOUS ADENOMA/TUBULAR ADENOMA). Prep marginal. TCS 3 months recommended  . COLONOSCOPY  01/18/2012   RMR: tubular adenoma. Surveillance due Dec 2016  . COLONOSCOPY W/ POLYPECTOMY  2002   Dr. Norberto Sorenson Stark-->65mm sessile trv colon polyp, path unavailable  . HERNIA REPAIR  1956   , right inguinal  . NASAL SEPTUM SURGERY  1974  . TONSILLECTOMY  44  . VASECTOMY  1988   ?  Subsequent reversal  . VASECTOMY REVERSAL      Home Medications:  Allergies as of 09/15/2019      Reactions   Bee Venom Anaphylaxis   Penicillins Shortness Of Breath, Swelling, Rash      Medication List       Accurate as of September 15, 2019 12:31 PM. If you have any questions, ask your nurse or doctor.        Accu-Chek Aviva Plus test strip Generic drug: glucose blood   Accu-Chek FastClix Lancets Misc   ALPRAZolam 1 MG tablet Commonly known as: XANAX   aspirin 81 MG tablet Take 81 mg by mouth daily.   atorvastatin 40 MG tablet Commonly known as: LIPITOR   Belsomra 10 MG  Tabs Generic drug: Suvorexant   benazepril 10 MG tablet Commonly known as: LOTENSIN Take 10 mg by mouth daily.   DULoxetine 60 MG capsule Commonly known as: CYMBALTA   EPINEPHrine 0.3 mg/0.3 mL Soaj injection Commonly known as: EpiPen 2-Pak Inject 0.3 mLs (0.3 mg total) into the muscle once.   gabapentin 400 MG capsule Commonly known as: NEURONTIN Take 400 mg by mouth in the morning, at noon, in the evening, and at bedtime.   glipiZIDE 5 MG tablet Commonly known as: GLUCOTROL   GLUCOSAMINE CHONDR 1500 COMPLX PO Take by mouth.   levothyroxine 75 MCG tablet Commonly known as: SYNTHROID Take 1 tablet (75 mcg total) by mouth daily.   Magnesium 400 MG Tabs Take 2 tablets by mouth 2 (two) times daily.   metFORMIN 1000 MG tablet Commonly known as: GLUCOPHAGE Take 1,000 mg by mouth 2 (two) times daily with a meal.   vitamin C 100 MG tablet Take 500 mg by mouth daily.       Allergies:  Allergies  Allergen Reactions  . Bee Venom Anaphylaxis  . Penicillins Shortness Of Breath, Swelling and Rash    Family History  Problem Relation Age of Onset  . Diabetes Father   . Heart attack Father   . Alzheimer's disease Mother   . Diabetes Paternal Grandfather   . Heart disease Paternal Grandfather   . Colon cancer Neg Hx   . Liver disease Neg Hx   . Inflammatory bowel disease Neg Hx     Social History:  reports that he has never smoked. He has never used smokeless tobacco. He reports that he does not drink alcohol and does not use drugs.  ROS: A complete review of systems was performed.  All systems are negative except for pertinent findings as noted.  Physical Exam:  Vital signs in last 24 hours: There were no vitals taken for this visit. Constitutional:  Alert and oriented, No acute distress Respiratory: Normal respiratory effort Neurologic: Grossly intact, no focal deficits Psychiatric: Normal mood and affect  I have reviewed prior pt notes  I have reviewed  notes from referring/previous physicians  I have reviewed PV U/S  I have reviewed prior PSA results Radiologic Imaging: No results found.  Impression/Assessment:  LUTS--probably OAAB>BPH--slightly improved w/ behavioral modification/bladder drills  Plan:  1. Pt encouraged regarding his adherence to the OAB worksheet and will continue with these exercises.  2. Pt to call if symptoms worsen.  3. F/U in 1 year for ov and symptom recheck.  CC: Dr. Sherrie Sport

## 2019-09-17 NOTE — Telephone Encounter (Signed)
Received signed Consent DAT scan is processing.

## 2019-09-24 NOTE — Telephone Encounter (Signed)
DAT scan approved sending to [facility] For scheduling.

## 2019-10-15 ENCOUNTER — Other Ambulatory Visit: Payer: Self-pay

## 2019-10-15 ENCOUNTER — Encounter (HOSPITAL_COMMUNITY)
Admission: RE | Admit: 2019-10-15 | Discharge: 2019-10-15 | Disposition: A | Discharge status: Home / Self Care | Payer: Medicare HMO | Source: Ambulatory Visit | Attending: Neurology | Admitting: Neurology

## 2019-10-15 ENCOUNTER — Ambulatory Visit (HOSPITAL_COMMUNITY)
Admission: RE | Admit: 2019-10-15 | Discharge: 2019-10-15 | Disposition: A | Discharge status: Home / Self Care | Payer: Medicare HMO | Source: Ambulatory Visit | Attending: Neurology | Admitting: Neurology

## 2019-10-15 DIAGNOSIS — R269 Unspecified abnormalities of gait and mobility: Secondary | ICD-10-CM | POA: Diagnosis not present

## 2019-10-15 DIAGNOSIS — R2689 Other abnormalities of gait and mobility: Secondary | ICD-10-CM | POA: Diagnosis not present

## 2019-10-15 MED ORDER — IODINE STRONG (LUGOLS) 5 % PO SOLN
0.8000 mL | Freq: Once | ORAL | Status: AC
  Administered 2019-10-15: 0.8 mL via ORAL

## 2019-10-15 MED ORDER — IODINE STRONG (LUGOLS) 5 % PO SOLN
ORAL | Status: AC
  Administered 2019-10-15: 0.8 mL via ORAL
  Filled 2019-10-15: qty 1

## 2019-10-15 MED ORDER — IOFLUPANE I 123 185 MBQ/2.5ML IV SOLN
4.6900 | Freq: Once | INTRAVENOUS | Status: AC | PRN
  Administered 2019-10-15: 4.69 via INTRAVENOUS
  Filled 2019-10-15: qty 5

## 2019-10-20 ENCOUNTER — Telehealth: Payer: Self-pay | Admitting: Neurology

## 2019-10-20 NOTE — Telephone Encounter (Signed)
I called the patient.  I talk with the wife.  The DaTscan was normal.  No evidence of Parkinson's disease or any parkinsonian-like syndrome.  The patient has had MRI of the brain that was unrevealing.  Upon further evaluation, the patient has had issues with orthostatic hypotension.  The wife has documented blood pressures in the 70 systolic while sitting.  The patient may become diaphoretic when upright, he may blackout.  The patient has ongoing falls.  Orthostasis may be a primary driving factor for this.  The patient has tremors that have been there for 4 years, he was on lithium previously and was toxic on it 4 years ago, he may have permanent tremors for this reason.  I have asked the wife to document blood pressure sitting and standing.  They will be seeing the primary doctor next week, he may need a reduction or cessation of blood pressure medications and if this is not effective, he may have to go on another medication such as midodrine or Florinef.  DAT scan 10/20/19:  IMPRESSION: Normal radiotracer activity in normal-shaped striata. No clear evidence of loss of dopamine transport populations.

## 2019-10-26 DIAGNOSIS — Z6830 Body mass index (BMI) 30.0-30.9, adult: Secondary | ICD-10-CM | POA: Diagnosis not present

## 2019-10-26 DIAGNOSIS — N400 Enlarged prostate without lower urinary tract symptoms: Secondary | ICD-10-CM | POA: Diagnosis not present

## 2019-10-26 DIAGNOSIS — E1165 Type 2 diabetes mellitus with hyperglycemia: Secondary | ICD-10-CM | POA: Diagnosis not present

## 2019-10-26 DIAGNOSIS — I10 Essential (primary) hypertension: Secondary | ICD-10-CM | POA: Diagnosis not present

## 2019-10-26 DIAGNOSIS — I952 Hypotension due to drugs: Secondary | ICD-10-CM | POA: Diagnosis not present

## 2019-10-26 DIAGNOSIS — E782 Mixed hyperlipidemia: Secondary | ICD-10-CM | POA: Diagnosis not present

## 2019-12-10 ENCOUNTER — Ambulatory Visit: Payer: Medicare HMO | Admitting: Neurology

## 2019-12-10 ENCOUNTER — Encounter: Payer: Self-pay | Admitting: Neurology

## 2019-12-10 DIAGNOSIS — E1142 Type 2 diabetes mellitus with diabetic polyneuropathy: Secondary | ICD-10-CM | POA: Diagnosis not present

## 2019-12-10 DIAGNOSIS — R269 Unspecified abnormalities of gait and mobility: Secondary | ICD-10-CM | POA: Diagnosis not present

## 2019-12-10 DIAGNOSIS — G8929 Other chronic pain: Secondary | ICD-10-CM

## 2019-12-10 DIAGNOSIS — M5441 Lumbago with sciatica, right side: Secondary | ICD-10-CM | POA: Diagnosis not present

## 2019-12-10 DIAGNOSIS — M545 Low back pain, unspecified: Secondary | ICD-10-CM

## 2019-12-10 HISTORY — DX: Low back pain, unspecified: M54.50

## 2019-12-10 HISTORY — DX: Other chronic pain: G89.29

## 2019-12-10 NOTE — Progress Notes (Signed)
Reason for visit: Gait disorder  Joshua Perez is an 69 y.o. male  History of present illness:  Joshua Perez is a 69 year old left-handed white male with a history of bipolar disorder.  The patient has had a chronic gait disorder over the last 4 to 5 years.  The ability to walk has become more difficult.  He has chronic neck and low back pain.  He has right hip pain as well.  The patient uses a cane to ambulate, he has been known to have significant drops in blood pressure that may increase the chance of falling.  His primary doctor has reduced his Lotensin dose to a 2.5 mg daily regimen, Florinef was added at 0.1 mg daily. The patient may still drop his systolic blood pressures to the 90 systolic range. The patient notes a lot of right hip pain, and he has not had this evaluated. He last had physical therapy 1 year ago. He was seen for evaluation of possible parkinson's disease, but the DAT scat was normal. He returns for further evaluation.  Past Medical History:  Diagnosis Date  . Abnormality of gait 09/15/2015  . Anxiety   . Arthritis   . Asthma   . Bipolar disorder ([place]) 1965   Onset in childhood; remote history of ECT  . Chronic low back pain 12/10/2019  . Depression   . Diabetes mellitus, type 2 ([place]) 1980   Recent hemoglobin A1c reportedly 5.3; controlled with single oral agent  . Diabetic peripheral neuropathy ([place]) 09/15/2015  . Erectile dysfunction   . High cholesterol   . Hypertension 1995  . Hypothyroidism   . Imbalance   . Joint pain   . Palpitations    Echocardiogram normal in 07/2007 except for mild LVH; Event recorder-first degree AV block, no symptomatic spells, no significant arrhythmias; stress nuclear negative in 1996  . Syncope and collapse 09/15/2015    Past Surgical History:  Procedure Laterality Date  . APPENDECTOMY    . BIOPSY  09/21/2011   NOT DONE. Unable to remove from [facility] list  . CHOLECYSTECTOMY    . COLONOSCOPY  09/21/2011   RMR: Colonic  polyps-treated/removed as described above/ Colonic diverticulosis. Lax sphincter time. Status post segmental biopsies(TUBULOVILLOUS ADENOMA/TUBULAR ADENOMA). Prep marginal. TCS 3 months recommended  . COLONOSCOPY  01/18/2012   RMR: tubular adenoma. Surveillance due Dec 2016  . COLONOSCOPY W/ POLYPECTOMY  2002   Dr. Norberto Sorenson Stark-->2mm sessile trv colon polyp, path unavailable  . HERNIA REPAIR  1956   , right inguinal  . NASAL SEPTUM SURGERY  1974  . TONSILLECTOMY  1957  . VASECTOMY  1988   ?  Subsequent reversal  . VASECTOMY REVERSAL      Family History  Problem Relation Age of Onset  . Diabetes Father   . Heart attack Father   . Alzheimer's disease Mother   . Diabetes Paternal Grandfather   . Heart disease Paternal Grandfather   . Colon cancer Neg Hx   . Liver disease Neg Hx   . Inflammatory bowel disease Neg Hx     Social history:  reports that he has never smoked. He has never used smokeless tobacco. He reports that he does not drink alcohol and does not use drugs.    Allergies  Allergen Reactions  . Bee Venom Anaphylaxis  . Penicillins Shortness Of Breath, Swelling and Rash    Medications:  Prior to Admission medications   Medication Sig Start Date End Date Taking? Authorizing Provider  ACCU-CHEK  AVIVA PLUS test strip  04/11/19  Yes [provider]  Accu-Chek FastClix Lancets Veg[place]a  05/15/19  Yes [provider]  ALPRAZolam Duanne Moron) 1 MG tablet  05/14/19  Yes [provider]  Ascorbic Acid (VITAMIN C) 100 MG tablet Take 500 mg by mouth daily.    Yes [provider]  aspirin 81 MG tablet Take 81 mg by mouth daily.     Yes [provider]  atorvastatin (LIPITOR) 40 MG tablet  06/12/19  Yes [provider]  benazepril (LOTENSIN) 10 MG tablet Take 2.5 mg by mouth daily.    Yes [provider]  DULoxetine (CYMBALTA) 60 MG capsule  05/15/19  Yes [provider]  EPINEPHrine (EPIPEN 2-PAK) 0.3 mg/0.3 mL SOAJ  injection Inject 0.3 mLs (0.3 mg total) into the muscle once. 11/03/12  Yes Mikey Kirschner, MD  fludrocortisone (FLORINEF) 0.1 MG tablet Take 0.1 mg by mouth daily.   Yes [provider]  gabapentin (NEURONTIN) 400 MG capsule Take 400 mg by mouth in the morning, at noon, in the evening, and at bedtime. 05/15/19  Yes [provider]  glipiZIDE (GLUCOTROL) 5 MG tablet  03/30/19  Yes [provider]  Glucosamine-Chondroit-Vit C-Mn (GLUCOSAMINE CHONDR 1500 COMPLX PO) Take by mouth.   Yes [provider]  levothyroxine (SYNTHROID, LEVOTHROID) 75 MCG tablet Take 1 tablet (75 mcg total) by mouth daily. 04/22/13  Yes Mikey Kirschner, MD  Magnesium 400 MG TABS Take 2 tablets by mouth 2 (two) times daily.  08/13/15  Yes [provider]  metFORMIN (GLUCOPHAGE) 1000 MG tablet Take 1,000 mg by mouth 2 (two) times daily with a meal.   Yes [provider]  BELSOMRA 10 MG TABS  05/06/19   [provider]    ROS:  Out of a complete 14 system review of symptoms, the patient complains only of the following symptoms, and all other reviewed systems are negative.  Walking difficulty Neck pain Low back pain  There were no vitals taken for this visit.    Blood pressure, right arm, standing is 112/60.  Physical Exam  General: The patient is alert and cooperative at the time of the examination.  Neuromuscular: The patient has significant right hip pain with rotational movements.  Skin: No significant peripheral edema is noted.   Neurologic Exam  Mental status: The patient is alert and oriented x 3 at the time of the examination. The patient has apparent normal recent and remote memory, with an apparently normal attention span and concentration ability.   Cranial nerves: Facial symmetry is present. Speech is normal, no aphasia or dysarthria is noted. Extraocular movements are full. Visual fields are full.  Motor: The patient has good strength in  all 4 extremities.  Sensory examination: Soft touch sensation is symmetric on the face, arms, and legs.  Coordination: The patient has good finger-nose-finger and heel-to-shin bilaterally.  Gait and station: The patient has a wide based gait, walks with a cane. Tandem gait was not tested, rhomberg is unsteady.  Reflexes: Deep tendon reflexes are symmetric, but are depressed.   MRI brain 08/26/19:  Impression:  No acute intracranial abnormality and essentially normal for age noncontrast MRI appearance of the brain.  * MRI scan images were reviewed online. I agree with the written report.    Assessment/Plan:  1. Gait disorder  2. Orthostatic hypotension  3. Chronic low back pain  4. Diabetic peripheral neuropathy  The patient does not have parkinson's disease, but he  likely does have a multifactorial gait disorder with a neuropathy, orthostasis, possible right hip disease and possible lumbar or cervical disease. We will get MRI of the lumbar spine and check EMG of the right leg and NCV of both legs. He will be set up for physical therapy. He will follow up in 4 months.  Jill Alexanders MD 12/10/2019 2:42 PM  [facility] [address] [place], [place] [address]  Phone [phone] Fax [phone]

## 2019-12-14 ENCOUNTER — Telehealth: Payer: Self-pay | Admitting: Neurology

## 2019-12-14 NOTE — Telephone Encounter (Signed)
schedule for 12/25/19 at 3:30 PM

## 2019-12-14 NOTE — Telephone Encounter (Signed)
Joshua Perez: [id number] (exp. 12/14/19 to 01/13/20) order faxed to [facility] they will reach out to the patient to schedule. Ph # [phone] & fax # [phone].

## 2019-12-25 DIAGNOSIS — M47816 Spondylosis without myelopathy or radiculopathy, lumbar region: Secondary | ICD-10-CM | POA: Diagnosis not present

## 2019-12-25 DIAGNOSIS — M5126 Other intervertebral disc displacement, lumbar region: Secondary | ICD-10-CM | POA: Diagnosis not present

## 2019-12-25 DIAGNOSIS — M48061 Spinal stenosis, lumbar region without neurogenic claudication: Secondary | ICD-10-CM | POA: Diagnosis not present

## 2019-12-25 DIAGNOSIS — M5127 Other intervertebral disc displacement, lumbosacral region: Secondary | ICD-10-CM | POA: Diagnosis not present

## 2020-01-04 ENCOUNTER — Telehealth: Payer: Self-pay | Admitting: Neurology

## 2020-01-04 NOTE — Telephone Encounter (Signed)
I called the patient.  MRI of the lumbar spine was done showing moderate to severe spinal stenosis at the L4-5 level with potential for L4 nerve root impingement bilaterally.  The patient has EMG nerve conduction study evaluation set up on 18 January 2020.  I will discuss the results with him further at that time.   MRI lumbar 12/25/19:  Impression:  1.  No acute osseous abnormality in the lumbar spine.  2.  Moderate to severe multifactorial spinal stenosis at the L4-5 level with up to moderate lateral recess stenosis and severe foraminal stenosis, greater on the left.  3.  Mild spinal and foraminal stenosis at L2-3 level.  Mild to moderate foraminal stenosis at the L3-4 level.

## 2020-01-13 DIAGNOSIS — M5441 Lumbago with sciatica, right side: Secondary | ICD-10-CM | POA: Diagnosis not present

## 2020-01-13 DIAGNOSIS — R269 Unspecified abnormalities of gait and mobility: Secondary | ICD-10-CM | POA: Diagnosis not present

## 2020-01-18 ENCOUNTER — Encounter: Payer: Medicare HMO | Admitting: Neurology

## 2020-01-19 DIAGNOSIS — R269 Unspecified abnormalities of gait and mobility: Secondary | ICD-10-CM | POA: Diagnosis not present

## 2020-01-19 DIAGNOSIS — M5441 Lumbago with sciatica, right side: Secondary | ICD-10-CM | POA: Diagnosis not present

## 2020-01-22 DIAGNOSIS — M5441 Lumbago with sciatica, right side: Secondary | ICD-10-CM | POA: Diagnosis not present

## 2020-01-22 DIAGNOSIS — R269 Unspecified abnormalities of gait and mobility: Secondary | ICD-10-CM | POA: Diagnosis not present

## 2020-01-26 DIAGNOSIS — E1143 Type 2 diabetes mellitus with diabetic autonomic (poly)neuropathy: Secondary | ICD-10-CM | POA: Diagnosis not present

## 2020-01-26 DIAGNOSIS — E782 Mixed hyperlipidemia: Secondary | ICD-10-CM | POA: Diagnosis not present

## 2020-01-26 DIAGNOSIS — F064 Anxiety disorder due to known physiological condition: Secondary | ICD-10-CM | POA: Diagnosis not present

## 2020-01-26 DIAGNOSIS — N400 Enlarged prostate without lower urinary tract symptoms: Secondary | ICD-10-CM | POA: Diagnosis not present

## 2020-01-26 DIAGNOSIS — Z6829 Body mass index (BMI) 29.0-29.9, adult: Secondary | ICD-10-CM | POA: Diagnosis not present

## 2020-01-26 DIAGNOSIS — I952 Hypotension due to drugs: Secondary | ICD-10-CM | POA: Diagnosis not present

## 2020-01-26 DIAGNOSIS — I10 Essential (primary) hypertension: Secondary | ICD-10-CM | POA: Diagnosis not present

## 2020-01-26 DIAGNOSIS — Z0000 Encounter for general adult medical examination without abnormal findings: Secondary | ICD-10-CM | POA: Diagnosis not present

## 2020-01-26 DIAGNOSIS — E1165 Type 2 diabetes mellitus with hyperglycemia: Secondary | ICD-10-CM | POA: Diagnosis not present

## 2020-01-27 DIAGNOSIS — R269 Unspecified abnormalities of gait and mobility: Secondary | ICD-10-CM | POA: Diagnosis not present

## 2020-01-27 DIAGNOSIS — M5441 Lumbago with sciatica, right side: Secondary | ICD-10-CM | POA: Diagnosis not present

## 2020-01-29 DIAGNOSIS — R269 Unspecified abnormalities of gait and mobility: Secondary | ICD-10-CM | POA: Diagnosis not present

## 2020-01-29 DIAGNOSIS — M5441 Lumbago with sciatica, right side: Secondary | ICD-10-CM | POA: Diagnosis not present

## 2020-02-01 ENCOUNTER — Telehealth: Payer: Self-pay | Admitting: Neurology

## 2020-02-01 DIAGNOSIS — R269 Unspecified abnormalities of gait and mobility: Secondary | ICD-10-CM | POA: Diagnosis not present

## 2020-02-01 DIAGNOSIS — H532 Diplopia: Secondary | ICD-10-CM

## 2020-02-01 DIAGNOSIS — M5441 Lumbago with sciatica, right side: Secondary | ICD-10-CM | POA: Diagnosis not present

## 2020-02-01 NOTE — Telephone Encounter (Signed)
PT Joshua Perez called wanting to inform provider that the pt has had a new onset of R knee buckling, double vision and still having issues with BP. Please call her back at [phone]

## 2020-02-01 NOTE — Addendum Note (Signed)
Addended by: Kathrynn Ducking on: 02/01/2020 05:19 PM   Modules accepted: Orders

## 2020-02-01 NOTE — Telephone Encounter (Signed)
I called the patient, talk with the daughter.  According to her, the patient had onset of double vision and right knee buckling that began suddenly yesterday.  There is no other issue such as slurred speech, facial droop, problems with use of the arms.  The patient does have significant issues with the low back that could cause leg weakness.  He has risk factors for stroke.  I will check MRI of the brain.  The patient will be seen in the office tomorrow.  If any change in his underlying condition, they are to go to the emergency room immediately.

## 2020-02-01 NOTE — Telephone Encounter (Signed)
Please advise/FYI  Joshua Perez, PT and she said FYI.  He has an appointment tomorrow @ 2p for EMG.

## 2020-02-02 ENCOUNTER — Encounter: Payer: Self-pay | Admitting: Neurology

## 2020-02-02 ENCOUNTER — Ambulatory Visit: Payer: Medicare HMO | Admitting: Neurology

## 2020-02-02 ENCOUNTER — Ambulatory Visit (INDEPENDENT_AMBULATORY_CARE_PROVIDER_SITE_OTHER): Payer: Medicare HMO | Admitting: Neurology

## 2020-02-02 ENCOUNTER — Other Ambulatory Visit: Payer: Self-pay

## 2020-02-02 ENCOUNTER — Telehealth: Payer: Self-pay | Admitting: Neurology

## 2020-02-02 DIAGNOSIS — M5441 Lumbago with sciatica, right side: Secondary | ICD-10-CM

## 2020-02-02 DIAGNOSIS — E1142 Type 2 diabetes mellitus with diabetic polyneuropathy: Secondary | ICD-10-CM

## 2020-02-02 DIAGNOSIS — G8929 Other chronic pain: Secondary | ICD-10-CM

## 2020-02-02 MED ORDER — TIZANIDINE HCL 2 MG PO TABS: ORAL_TABLET | ORAL | 2 refills | Status: DC

## 2020-02-02 NOTE — Telephone Encounter (Signed)
[name] Kaufmann: [phone] (exp. 02/02/20 to 03/03/20) order faxed to [facility] they will reach out to the patient to schedule.

## 2020-02-02 NOTE — Progress Notes (Signed)
Please refer to EMG and nerve conduction procedure note.  

## 2020-02-02 NOTE — Procedures (Signed)
     HISTORY:  Joshua Perez is a 69 year old gentleman with a history of diabetes and diabetic neuropathy.  He complains of numbness and tingling discomfort in the feet, this is keeping him awake at night.  He also has back pain and pain down the right leg to the knee.  MRI of the low back as shown evidence of spinal stenosis at the L4-5 level.  The patient is being evaluated for possible neuropathy or a lumbar radiculopathy.  NERVE CONDUCTION STUDIES:  Nerve conduction studies were performed on both lower extremities.  The distal motor latencies for the peroneal and posterior tibial nerves were normal with low motor amplitudes seen for the right peroneal nerve and for the posterior tibial nerves bilaterally, normal for the left peroneal nerve.  Slowing was seen for the peroneal nerves bilaterally and for the posterior tibial nerves bilaterally.  Prolongation of the left sural and peroneal sensory latencies were seen, normal on the right.  The F-wave latencies for the posterior tibial nerves were prolonged bilaterally.  EMG STUDIES:  EMG study was performed on the right lower extremity:  The tibialis anterior muscle reveals 2 to 5K motor units with slightly reduced recruitment. No fibrillations or positive waves were seen. The peroneus tertius muscle reveals 2 to 6K motor units with decreased recruitment. No fibrillations or positive waves were seen. The medial gastrocnemius muscle reveals 1 to 5K motor units with slightly decreased recruitment. No fibrillations or positive waves were seen. The vastus lateralis muscle reveals 2 to 4K motor units with full recruitment. No fibrillations or positive waves were seen. The iliopsoas muscle reveals 2 to 4K motor units with full recruitment. No fibrillations or positive waves were seen. The biceps femoris muscle (long head) reveals 2 to 4K motor units with full recruitment. No fibrillations or positive waves were seen. The lumbosacral paraspinal  muscles were tested at 3 levels, and revealed no abnormalities of insertional activity at all 3 levels tested. There was good relaxation.   IMPRESSION:  Nerve conduction studies done on both lower extremities shows slowing consistent with a primarily axonal peripheral neuropathy of moderate severity.  The EMG evaluation of the right lower extremity shows distal chronic stable neuropathic signs of denervation consistent with the diagnosis of peripheral neuropathy.  An overlying lumbosacral radiculopathy cannot be confirmed on this evaluation.  Jill Alexanders MD 02/02/2020 2:25 PM  [facility] [address] [place], [place] [address]  Phone [phone] Fax [phone]

## 2020-02-02 NOTE — Telephone Encounter (Signed)
[employer] pending  

## 2020-02-02 NOTE — Progress Notes (Addendum)
The patient has had EMG nerve conduction study evaluation.  He has moderate to severe spinal stenosis at the L4-5 level.  EMG of the right leg shows distal chronic neuropathic denervation consistent with peripheral neuropathy, he does have a neuropathy by nerve conduction studies.  The patient is having a lot of back pain and pain down the right leg.  EMG did not show definite evidence of an acute radiculopathy.  We will set him up for an epidural steroid injection.  The patient has left neck pain as well and headaches coming up from the back of the head over the last 2 months.  I will give him a trial on tizanidine, he has a history of orthostatic hypotension, do not want to use nortriptyline.  In the future we may build to go up on the dose of the gabapentin in the evening to help him rest better.  He currently takes 400 mg 4 times daily.     [place]    Nerve / Sites Muscle Latency Ref. Amplitude Ref. Rel Amp Segments Distance Velocity Ref. Area    ms ms mV mV %  cm m/s m/s mVms  L Peroneal - EDB     Ankle EDB 6.4 ?6.5 2.1 ?2.0 100 Ankle - EDB 9   7.1     Fib head EDB 14.9  2.2  107 Fib head - Ankle 31 37 ?44 10.7     Pop fossa EDB 17.9  2.4  108 Pop fossa - Fib head 10 33 ?44 13.2         Pop fossa - Ankle      R Peroneal - EDB     Ankle EDB 6.5 ?6.5 0.2 ?2.0 100 Ankle - EDB 9   1.1     Fib head EDB 15.8  0.2  92.7 Fib head - Ankle 32 34 ?44 0.7     Pop fossa EDB 18.9  0.2  106 Pop fossa - Fib head 10 33 ?44 0.8         Pop fossa - Ankle      L Tibial - AH     Ankle AH 5.3 ?5.8 0.4 ?4.0 100 Ankle - AH 9   2.8     Pop fossa AH 18.9  0.2  48.9 Pop fossa - Ankle 41 30 ?41 1.1  R Tibial - AH     Ankle AH 4.6 ?5.8 0.4 ?4.0 100 Ankle - AH 9   2.0     Pop fossa AH 18.6  0.2  55.2 Pop fossa - Ankle 42 30 ?41 0.7             SNC    Nerve / Sites Rec. Site Peak Lat Ref.  Amp Ref. Segments Distance    ms ms V V  cm  L Sural - Ankle (Calf)     Calf Ankle 4.9 ?4.4 4 ?6 Calf - Ankle 14  R  Sural - Ankle (Calf)     Calf Ankle 4.0 ?4.4 6 ?6 Calf - Ankle 14  L Superficial peroneal - Ankle     Lat leg Ankle 4.6 ?4.4 4 ?6 Lat leg - Ankle 14  R Superficial peroneal - Ankle     Lat leg Ankle 4.2 ?4.4 4 ?6 Lat leg - Ankle 14             F  Wave    Nerve F Lat Ref.   ms ms  L Tibial - AH 72.1 ?56.0  R Tibial - AH 74.0 ?56.0

## 2020-02-03 ENCOUNTER — Other Ambulatory Visit: Payer: Self-pay | Admitting: Neurology

## 2020-02-03 DIAGNOSIS — M48061 Spinal stenosis, lumbar region without neurogenic claudication: Secondary | ICD-10-CM

## 2020-02-03 DIAGNOSIS — R269 Unspecified abnormalities of gait and mobility: Secondary | ICD-10-CM | POA: Diagnosis not present

## 2020-02-03 DIAGNOSIS — M5441 Lumbago with sciatica, right side: Secondary | ICD-10-CM | POA: Diagnosis not present

## 2020-02-09 ENCOUNTER — Telehealth: Payer: Self-pay | Admitting: Neurology

## 2020-02-09 DIAGNOSIS — M5441 Lumbago with sciatica, right side: Secondary | ICD-10-CM | POA: Diagnosis not present

## 2020-02-09 DIAGNOSIS — R269 Unspecified abnormalities of gait and mobility: Secondary | ICD-10-CM | POA: Diagnosis not present

## 2020-02-09 NOTE — Telephone Encounter (Signed)
Pharmacist Wyn Forster called on behalf of the pt. About a muscle relaxer that Dr. Anne Hahn gave the pt. Not working and Dr. Anne Hahn told the pt to call back if the muscle did not work. Pt states that migraines are becoming excruciating. Best call back for pt is [phone]

## 2020-02-11 MED ORDER — TIZANIDINE HCL 2 MG PO TABS: ORAL_TABLET | ORAL | 1 refills | Status: DC

## 2020-02-11 NOTE — Telephone Encounter (Signed)
Called patient and wife stated that patient needs an increase in the tizanadine, she said Dr. Anne Hahn said in the office visit, he may need to have it increased but wanted to make sure patient wouldn't have any balance problems while taking it.  Stated that the muscle relaxer helped with the headaches for the first few days and knows it is coming from his neck.  She said it is no longer helping and it did not affect his balance.    Asking for a call from a provider today please.   This message was forwarded to Dr. Frances Furbish, the [facility].

## 2020-02-11 NOTE — Telephone Encounter (Signed)
Called patient back and went over Dr. Frances Furbish recommendation to increasing Tizanidine.  Patient was able to return verbalization of dosing to 2 tablets in the morning and1 in the afternoon and 2 at bedtime.  She denied any additional questions.  .  Advised her if she had any additional questions we would be back in the office on Monday.  She expressed apprecation.

## 2020-02-11 NOTE — Telephone Encounter (Signed)
We can try to increase the tizanidine to 2 pills in the morning, 1 at lunch and 2 in the evening.

## 2020-02-16 DIAGNOSIS — M5441 Lumbago with sciatica, right side: Secondary | ICD-10-CM | POA: Diagnosis not present

## 2020-02-16 DIAGNOSIS — R269 Unspecified abnormalities of gait and mobility: Secondary | ICD-10-CM | POA: Diagnosis not present

## 2020-03-01 MED ORDER — GABAPENTIN 400 MG PO CAPS: ORAL_CAPSULE | ORAL | 1 refills | Status: DC

## 2020-03-01 MED ORDER — TIZANIDINE HCL 4 MG PO TABS: 4.0000 mg | ORAL_TABLET | Freq: Three times a day (TID) | ORAL | 3 refills | Status: DC

## 2020-03-01 NOTE — Telephone Encounter (Signed)
Pt asking for his tiZANidine (ZANAFLEX) 2 MG tablet be increased, his headaches are from his neck to his head

## 2020-03-01 NOTE — Addendum Note (Signed)
Addended by: Kathrynn Ducking on: 03/01/2020 04:34 PM   Modules accepted: Orders

## 2020-03-01 NOTE — Telephone Encounter (Signed)
I called the patient.  The patient indicates that he did get some benefit from the Zanaflex 2 mg tablets when he first started them.  We will go up to 4 mg 3 times daily, I will also increase the gabapentin taking 400 mg 3 times during the day and 800 mg at night.

## 2020-03-16 ENCOUNTER — Telehealth: Payer: Self-pay | Admitting: Neurology

## 2020-03-16 DIAGNOSIS — M542 Cervicalgia: Secondary | ICD-10-CM

## 2020-03-16 MED ORDER — PREDNISONE 5 MG PO TABS: ORAL_TABLET | ORAL | 0 refills | Status: DC

## 2020-03-16 NOTE — Telephone Encounter (Signed)
Pt. is asking for an increase for tiZANidine (ZANAFLEX) 4 MG tablet. He states he is having terrible headaches & neck pain. Please advise.

## 2020-03-16 NOTE — Addendum Note (Signed)
Addended by: Kathrynn Ducking on: 03/16/2020 11:06 AM   Modules accepted: Orders

## 2020-03-16 NOTE — Telephone Encounter (Signed)
I called the patient.  The patient was increased on the tizanidine to 4 mg 3 times daily, this did help transiently but the patient continues to have severe headaches and neck pain.  He reports no pain down the arms on either side.  We will get him set up for physical therapy, I will give him a short course of prednisone, he claims that his diabetes is under good control.  I will set him up for an MRI of the cervical spine, he wishes to have this done at [facility].  The patient has had significant neck pain and headaches since November 2021.

## 2020-03-16 NOTE — Telephone Encounter (Signed)
[employer] pending  

## 2020-03-17 NOTE — Telephone Encounter (Signed)
[name] Kaufmann: [id number] (exp. 03/16/20 to 04/15/20) order faxed to [facility]. They will reach out to the patient to schedule. Ph # [phone] & fax # [phone].

## 2020-03-28 DIAGNOSIS — M542 Cervicalgia: Secondary | ICD-10-CM | POA: Diagnosis not present

## 2020-03-28 DIAGNOSIS — M47812 Spondylosis without myelopathy or radiculopathy, cervical region: Secondary | ICD-10-CM | POA: Diagnosis not present

## 2020-03-28 DIAGNOSIS — M9971 Connective tissue and disc stenosis of intervertebral foramina of cervical region: Secondary | ICD-10-CM | POA: Diagnosis not present

## 2020-03-29 ENCOUNTER — Telehealth: Payer: Self-pay | Admitting: Neurology

## 2020-03-29 DIAGNOSIS — M542 Cervicalgia: Secondary | ICD-10-CM

## 2020-03-29 NOTE — Telephone Encounter (Signed)
I called the patient.  The patient does have some degree of arthritic changes in the neck with moderate spinal stenosis at C3-4 and C5-6 levels, neuroforaminal stenosis at the C5-6 level bilaterally.  He was given a trial on oral prednisone but this was not very helpful, I will get him set up for an epidural injection of the neck.  He wishes to have this done at [facility].  MRI cervical 03/28/20:  Impression:  1.  Moderate cervical spondylosis slightly progressed from prior CT. 2.  Moderate canal stenosis at C3-4 and C5-6. 3.  Moderate to severe bilateral foraminal stenosis at C5-6.

## 2020-03-30 NOTE — Telephone Encounter (Signed)
I called the patient and I talk with the wife.  [facility] does not do epidural injections in the cervical spine, I will get this set up at any pain.

## 2020-03-30 NOTE — Telephone Encounter (Signed)
Noted Order sent to [facility] . [phone]

## 2020-03-30 NOTE — Telephone Encounter (Signed)
Dr. Jannifer Franklin   Mr . Burleson will have to go to [place] . [place] can't do  [facility] radiology  RE: cervical spondylosis, request epidural steroid injection .  Please advise with ok I will call Patient's wife and send to GI .    Thanks Hinton Dyer

## 2020-03-30 NOTE — Telephone Encounter (Signed)
[facility] is doing doing Epidural steroid injections . They don't have staffing .   I can try [place] or [facility] imaging .   Thanks Hinton Dyer

## 2020-03-31 ENCOUNTER — Other Ambulatory Visit: Payer: Self-pay | Admitting: Neurology

## 2020-03-31 DIAGNOSIS — M47812 Spondylosis without myelopathy or radiculopathy, cervical region: Secondary | ICD-10-CM

## 2020-04-04 ENCOUNTER — Ambulatory Visit
Admission: RE | Admit: 2020-04-04 | Discharge: 2020-04-04 | Disposition: A | Discharge status: Home / Self Care | Payer: Medicare HMO | Source: Ambulatory Visit | Attending: Neurology | Admitting: Neurology

## 2020-04-04 DIAGNOSIS — M50223 Other cervical disc displacement at C6-C7 level: Secondary | ICD-10-CM | POA: Diagnosis not present

## 2020-04-04 DIAGNOSIS — M47812 Spondylosis without myelopathy or radiculopathy, cervical region: Secondary | ICD-10-CM

## 2020-04-04 DIAGNOSIS — M50222 Other cervical disc displacement at C5-C6 level: Secondary | ICD-10-CM | POA: Diagnosis not present

## 2020-04-04 MED ORDER — METHYLPREDNISOLONE ACETATE 40 MG/ML INJ SUSP (RADIOLOG
120.0000 mg | Freq: Once | INTRAMUSCULAR | Status: AC
  Administered 2020-04-04: 120 mg via EPIDURAL

## 2020-04-04 MED ORDER — IOPAMIDOL (ISOVUE-M 200) INJECTION 41%
1.0000 mL | Freq: Once | INTRAMUSCULAR | Status: AC
  Administered 2020-04-04: 1 mL via EPIDURAL

## 2020-04-04 NOTE — Discharge Instructions (Signed)

## 2020-04-14 ENCOUNTER — Ambulatory Visit: Payer: Medicare HMO | Admitting: Neurology

## 2020-04-14 ENCOUNTER — Encounter: Payer: Self-pay | Admitting: Neurology

## 2020-04-14 VITALS — BP 146/87 | HR 80 | Ht 74.0 in | Wt 233.0 lb

## 2020-04-14 DIAGNOSIS — M48061 Spinal stenosis, lumbar region without neurogenic claudication: Secondary | ICD-10-CM

## 2020-04-14 DIAGNOSIS — F319 Bipolar disorder, unspecified: Secondary | ICD-10-CM | POA: Diagnosis not present

## 2020-04-14 MED ORDER — BENAZEPRIL HCL 5 MG PO TABS: 2.5000 mg | ORAL_TABLET | Freq: Every day | ORAL | Status: DC

## 2020-04-14 MED ORDER — ZOLPIDEM TARTRATE 10 MG PO TABS: 10.0000 mg | ORAL_TABLET | Freq: Every evening | ORAL | 0 refills | Status: DC | PRN

## 2020-04-14 NOTE — Progress Notes (Signed)
Reason for visit: Lumbar spinal stenosis, diabetic peripheral neuropathy, gait disorder, cervical spondylosis  Joshua Perez is an 70 y.o. male  History of present illness:  Joshua Perez is a 70 year old left-handed white male with a history of diabetes with diabetic peripheral neuropathy.  The patient has developed a chronic gait disorder, he requires a walker to get around.  He is on gabapentin for the peripheral neuropathy.  He has a history of bipolar disorder.  He does have some back pain and pain into the right hip in particular.  He has recently developed significant neck pain and pain into the right shoulder.  MRI of the cervical spine has been done and shows evidence of neuroforaminal stenosis at the C5-6 levels bilaterally.  The patient has lumbar spinal stenosis of a moderate to severe level at the L4-5 level.  He gained good improvement with his neck and back pain on oral prednisone, but the first epidural steroid injection the cervical spine offered benefit only for 3 to 4 days.  He plans to reschedule a second epidural steroid injection in the near future.  He denies any falls but he stumbles frequently.  He has chronic insomnia, he has not responded to multiple sleeping aids such as Benadryl, melatonin, Ativan, trazodone, and Belsomra.  He returns to the office today for an evaluation.  Past Medical History:  Diagnosis Date  . Abnormality of gait 09/15/2015  . Anxiety   . Arthritis   . Asthma   . Bipolar disorder ([place]) 1965   Onset in childhood; remote history of ECT  . Chronic low back pain 12/10/2019  . Depression   . Diabetes mellitus, type 2 ([place]) 1980   Recent hemoglobin A1c reportedly 5.3; controlled with single oral agent  . Diabetic peripheral neuropathy ([place]) 09/15/2015  . Erectile dysfunction   . High cholesterol   . Hypertension 1995  . Hypothyroidism   . Imbalance   . Joint pain   . Palpitations    Echocardiogram normal in 07/2007 except for mild LVH; Event  recorder-first degree AV block, no symptomatic spells, no significant arrhythmias; stress nuclear negative in 1996  . Syncope and collapse 09/15/2015    Past Surgical History:  Procedure Laterality Date  . APPENDECTOMY    . BIOPSY  09/21/2011   NOT DONE. Unable to remove from [facility] list  . CHOLECYSTECTOMY    . COLONOSCOPY  09/21/2011   RMR: Colonic polyps-treated/removed as described above/ Colonic diverticulosis. Lax sphincter time. Status post segmental biopsies(TUBULOVILLOUS ADENOMA/TUBULAR ADENOMA). Prep marginal. TCS 3 months recommended  . COLONOSCOPY  01/18/2012   RMR: tubular adenoma. Surveillance due Dec 2016  . COLONOSCOPY W/ POLYPECTOMY  2002   Dr. Norberto Sorenson Stark-->51mm sessile trv colon polyp, path unavailable  . HERNIA REPAIR  1956   , right inguinal  . NASAL SEPTUM SURGERY  1974  . TONSILLECTOMY  1957  . VASECTOMY  1988   ?  Subsequent reversal  . VASECTOMY REVERSAL      Family History  Problem Relation Age of Onset  . Diabetes Father   . Heart attack Father   . Alzheimer's disease Mother   . Diabetes Paternal Grandfather   . Heart disease Paternal Grandfather   . Colon cancer Neg Hx   . Liver disease Neg Hx   . Inflammatory bowel disease Neg Hx     Social history:  reports that he has never smoked. He has never used smokeless tobacco. He reports that he does not drink alcohol and  does not use drugs.    Allergies  Allergen Reactions  . Bee Venom Anaphylaxis  . Penicillins Shortness Of Breath, Swelling and Rash    Medications:  Prior to Admission medications   Medication Sig Start Date End Date Taking? Authorizing Provider  ACCU-CHEK AVIVA PLUS test strip  04/11/19  Yes [provider]  Accu-Chek FastClix Lancets [place]  05/15/19  Yes [provider]  ALPRAZolam Duanne Moron) 1 MG tablet  05/14/19  Yes [provider]  Ascorbic Acid (VITAMIN C) 100 MG tablet Take 500 mg by mouth daily.    Yes [provider]  aspirin 81 MG tablet Take 81  mg by mouth daily.     Yes [provider]  atorvastatin (LIPITOR) 40 MG tablet  06/12/19  Yes [provider]  DULoxetine (CYMBALTA) 60 MG capsule  05/15/19  Yes [provider]  EPINEPHrine (EPIPEN 2-PAK) 0.3 mg/0.3 mL SOAJ injection Inject 0.3 mLs (0.3 mg total) into the muscle once. 11/03/12  Yes Mikey Kirschner, MD  fludrocortisone (FLORINEF) 0.1 MG tablet Take 0.1 mg by mouth daily.   Yes [provider]  gabapentin (NEURONTIN) 400 MG capsule 1 capsule in the morning, 1 midday, 1 in the evening and 2 at bedtime 03/01/20  Yes Kathrynn Ducking, MD  glipiZIDE (GLUCOTROL) 5 MG tablet  03/30/19  Yes [provider]  Glucosamine-Chondroit-Vit C-Mn (GLUCOSAMINE CHONDR 1500 COMPLX PO) Take by mouth.   Yes [provider]  levothyroxine (SYNTHROID, LEVOTHROID) 75 MCG tablet Take 1 tablet (75 mcg total) by mouth daily. 04/22/13  Yes Mikey Kirschner, MD  Magnesium 400 MG TABS Take 2 tablets by mouth 2 (two) times daily.  08/13/15  Yes [provider]  metFORMIN (GLUCOPHAGE) 1000 MG tablet Take 1,000 mg by mouth 2 (two) times daily with a meal.   Yes [provider]  tiZANidine (ZANAFLEX) 4 MG tablet Take 1 tablet (4 mg total) by mouth 3 (three) times daily. 03/01/20  Yes Kathrynn Ducking, MD  BELSOMRA 10 MG TABS  05/06/19   [provider]  benazepril (LOTENSIN) 10 MG tablet Take 2.5 mg by mouth daily.     [provider]  predniSONE (DELTASONE) 5 MG tablet Begin taking 6 tablets daily, taper by one tablet daily until off the medication. 03/16/20   Kathrynn Ducking, MD    ROS:  Out of a complete 14 system review of symptoms, the patient complains only of the following symptoms, and all other reviewed systems are negative.  Insomnia Neck pain Low back pain Walking difficulty  Blood pressure (!) 146/87, pulse 80, height 6\' 2"  (1.88 m), weight 233 lb (105.7 kg).  Physical Exam  General: The patient is alert and  cooperative at the time of the examination.  Skin: No significant peripheral edema is noted.   Neurologic Exam  Mental status: The patient is alert and oriented x 3 at the time of the examination. The patient has apparent normal recent and remote memory, with an apparently normal attention span and concentration ability.   Cranial nerves: Facial symmetry is present. Speech is normal, no aphasia or dysarthria is noted. Extraocular movements are full. Visual fields are full.  Motor: The patient has good strength in all 4 extremities.  Sensory examination: Soft touch sensation is symmetric on the face, arms, and legs, but the patient has decreased sensation in the legs.  Coordination: The patient has good finger-nose-finger and heel-to-shin bilaterally.  Gait and station: The patient has a  wide-based gait.  He usually walks with a walker.  Tandem gait is not attempted.  Romberg is positive.  Reflexes: Deep tendon reflexes are symmetric, but are depressed.   MRI cervical 03/28/20:  Impression:  1.  Moderate cervical spondylosis slightly progressed from prior CT. 2.  Moderate canal stenosis at C3-4 and C5-6. 3.  Moderate to severe bilateral foraminal stenosis at C5-6.   MRI lumbar 12/25/19:  Impression:  1.  No acute osseous abnormality in the lumbar spine.  2.  Moderate to severe multifactorial spinal stenosis at the L4-5 level with up to moderate lateral recess stenosis and severe foraminal stenosis, greater on the left.  3.  Mild spinal and foraminal stenosis at L2-3 level.  Mild to moderate foraminal stenosis at the L3-4 level.   MRI brain7/14/21:  Impression:  No acute intracranial abnormality and essentially normal for age noncontrast MRI appearance of the brain.    Assessment/Plan:  1.  Chronic gait disorder  2.  Diabetic peripheral neuropathy  3.  Cervical spondylosis, chronic neck pain  4.  Chronic low back pain, lumbar spinal stenosis, L 4 5  level  5.  Chronic insomnia  The patient will be set up for an epidural injection of the low back.  If the pain continues, a surgical consultation may be indicated.  The patient will be given Ambien for sleep.  He will follow up here in 5 months.  Jill Alexanders MD 04/14/2020 11:37 AM  [facility] [address] [place], [place] [address]  Phone [phone] Fax [phone]

## 2020-04-19 ENCOUNTER — Other Ambulatory Visit: Payer: Self-pay | Admitting: Neurology

## 2020-04-19 DIAGNOSIS — G8929 Other chronic pain: Secondary | ICD-10-CM

## 2020-04-19 DIAGNOSIS — M545 Low back pain, unspecified: Secondary | ICD-10-CM

## 2020-04-22 ENCOUNTER — Inpatient Hospital Stay: Admission: RE | Admit: 2020-04-22 | Payer: Medicare HMO | Source: Ambulatory Visit

## 2020-04-25 ENCOUNTER — Telehealth: Payer: Self-pay | Admitting: Neurology

## 2020-04-25 MED ORDER — TIZANIDINE HCL 4 MG PO TABS: 4.0000 mg | ORAL_TABLET | Freq: Four times a day (QID) | ORAL | 3 refills | Status: DC

## 2020-04-25 NOTE — Telephone Encounter (Signed)
I talked with the patient.  He is still having a lot of neck discomfort, he is to be set up for an epidural of the low back in the near future, wishes to go up on the tizanidine, okay to go to 4 mg 4 times daily.

## 2020-04-25 NOTE — Telephone Encounter (Signed)
I called the patient, left a message, I will call back later. 

## 2020-04-25 NOTE — Telephone Encounter (Signed)
Pt called, my pain level is not decreasing very much. Can  tiZANidine (ZANAFLEX) 4 MG tablet be increased to to 4 tablet per day? Would like a call from the nurse.

## 2020-04-26 DIAGNOSIS — I952 Hypotension due to drugs: Secondary | ICD-10-CM | POA: Diagnosis not present

## 2020-04-26 DIAGNOSIS — Z6829 Body mass index (BMI) 29.0-29.9, adult: Secondary | ICD-10-CM | POA: Diagnosis not present

## 2020-04-26 DIAGNOSIS — F064 Anxiety disorder due to known physiological condition: Secondary | ICD-10-CM | POA: Diagnosis not present

## 2020-04-26 DIAGNOSIS — N400 Enlarged prostate without lower urinary tract symptoms: Secondary | ICD-10-CM | POA: Diagnosis not present

## 2020-04-26 DIAGNOSIS — E782 Mixed hyperlipidemia: Secondary | ICD-10-CM | POA: Diagnosis not present

## 2020-04-26 DIAGNOSIS — I10 Essential (primary) hypertension: Secondary | ICD-10-CM | POA: Diagnosis not present

## 2020-04-26 DIAGNOSIS — E1143 Type 2 diabetes mellitus with diabetic autonomic (poly)neuropathy: Secondary | ICD-10-CM | POA: Diagnosis not present

## 2020-05-02 ENCOUNTER — Ambulatory Visit
Admission: RE | Admit: 2020-05-02 | Discharge: 2020-05-02 | Disposition: A | Discharge status: Home / Self Care | Payer: Medicare HMO | Source: Ambulatory Visit | Attending: Neurology | Admitting: Neurology

## 2020-05-02 ENCOUNTER — Other Ambulatory Visit: Payer: Self-pay

## 2020-05-02 DIAGNOSIS — M545 Low back pain, unspecified: Secondary | ICD-10-CM

## 2020-05-02 DIAGNOSIS — M48061 Spinal stenosis, lumbar region without neurogenic claudication: Secondary | ICD-10-CM | POA: Diagnosis not present

## 2020-05-02 DIAGNOSIS — G8929 Other chronic pain: Secondary | ICD-10-CM

## 2020-05-02 MED ORDER — METHYLPREDNISOLONE ACETATE 40 MG/ML INJ SUSP (RADIOLOG
120.0000 mg | Freq: Once | INTRAMUSCULAR | Status: AC
  Administered 2020-05-02: 120 mg via EPIDURAL

## 2020-05-02 MED ORDER — IOPAMIDOL (ISOVUE-M 200) INJECTION 41%
1.0000 mL | Freq: Once | INTRAMUSCULAR | Status: AC
  Administered 2020-05-02: 1 mL via EPIDURAL

## 2020-05-02 NOTE — Discharge Instructions (Signed)

## 2020-05-13 ENCOUNTER — Other Ambulatory Visit: Payer: Self-pay | Admitting: Neurology

## 2020-05-13 NOTE — Telephone Encounter (Signed)
Pt has [facility] for a [phone]

## 2020-05-16 MED ORDER — ZOLPIDEM TARTRATE 10 MG PO TABS: 10.0000 mg | ORAL_TABLET | Freq: Every evening | ORAL | 0 refills | Status: DC | PRN

## 2020-05-16 NOTE — Addendum Note (Signed)
Addended by: Rhae Lerner R on: 05/16/2020 08:30 AM   Modules accepted: Orders

## 2020-05-26 ENCOUNTER — Other Ambulatory Visit: Payer: Self-pay | Admitting: Neurology

## 2020-06-13 ENCOUNTER — Telehealth: Payer: Self-pay

## 2020-06-13 NOTE — Telephone Encounter (Signed)
I received a phone call from this patient asking for a message to be sent back to Dr. Jannifer Franklin. He advised that he is having dramatic problems with his movements, forward movements cause him to fall and he has no control of his movements. His spinal/back muscles are in terrible pain.   He would like to know if this is something you could help him with or if there is somewhere else you could refer him for these problems.   Joshua Perez, would you be able to give him a call to discuss these issues?

## 2020-06-14 NOTE — Telephone Encounter (Signed)
I called the patient.  The patient has had a chronic gait disorder over the last 5 or 6 years, it is gradually getting worse.  He does have a diabetic peripheral neuropathy but also has lumbosacral spinal stenosis at the L4-5 level.  He has been evaluated in the past for possible Parkinson's disease, a DaTscan was negative.  He had physical therapy for his walking in the fall 2021, he says it did not help him.  He underwent an epidural injection of the back, he claims that this did not help his back pain, he continues to have pain.  He believes that the epidural made his pain worse.  I have recommended a neurosurgical referral, he wants to think about this and let me know what he wants to do.  He is using a walker mainly outside of the house, he indicates that when he bends over to pick up something, he will tend to go forward and fall.  He likely has a multifactorial gait disorder, associated with the peripheral neuropathy and possibly related in part to the spinal stenosis as well.

## 2020-06-16 ENCOUNTER — Other Ambulatory Visit: Payer: Self-pay | Admitting: Neurology

## 2020-06-21 DIAGNOSIS — G20 Parkinson's disease: Secondary | ICD-10-CM | POA: Diagnosis not present

## 2020-06-21 DIAGNOSIS — H04123 Dry eye syndrome of bilateral lacrimal glands: Secondary | ICD-10-CM | POA: Diagnosis not present

## 2020-06-21 DIAGNOSIS — E119 Type 2 diabetes mellitus without complications: Secondary | ICD-10-CM | POA: Diagnosis not present

## 2020-07-04 ENCOUNTER — Other Ambulatory Visit: Payer: Self-pay | Admitting: Emergency Medicine

## 2020-07-04 MED ORDER — GABAPENTIN 400 MG PO CAPS: ORAL_CAPSULE | ORAL | 1 refills | Status: DC

## 2020-07-27 DIAGNOSIS — F064 Anxiety disorder due to known physiological condition: Secondary | ICD-10-CM | POA: Diagnosis not present

## 2020-07-27 DIAGNOSIS — E782 Mixed hyperlipidemia: Secondary | ICD-10-CM | POA: Diagnosis not present

## 2020-07-27 DIAGNOSIS — N400 Enlarged prostate without lower urinary tract symptoms: Secondary | ICD-10-CM | POA: Diagnosis not present

## 2020-07-27 DIAGNOSIS — Z6828 Body mass index (BMI) 28.0-28.9, adult: Secondary | ICD-10-CM | POA: Diagnosis not present

## 2020-07-27 DIAGNOSIS — I959 Hypotension, unspecified: Secondary | ICD-10-CM | POA: Diagnosis not present

## 2020-07-27 DIAGNOSIS — I10 Essential (primary) hypertension: Secondary | ICD-10-CM | POA: Diagnosis not present

## 2020-07-27 DIAGNOSIS — E1143 Type 2 diabetes mellitus with diabetic autonomic (poly)neuropathy: Secondary | ICD-10-CM | POA: Diagnosis not present

## 2020-08-03 DIAGNOSIS — I2510 Atherosclerotic heart disease of native coronary artery without angina pectoris: Secondary | ICD-10-CM | POA: Diagnosis not present

## 2020-08-03 DIAGNOSIS — I959 Hypotension, unspecified: Secondary | ICD-10-CM | POA: Diagnosis not present

## 2020-08-03 DIAGNOSIS — R031 Nonspecific low blood-pressure reading: Secondary | ICD-10-CM | POA: Diagnosis not present

## 2020-08-03 DIAGNOSIS — N200 Calculus of kidney: Secondary | ICD-10-CM | POA: Diagnosis not present

## 2020-08-03 DIAGNOSIS — I700 Atherosclerosis of aorta: Secondary | ICD-10-CM | POA: Diagnosis not present

## 2020-08-03 DIAGNOSIS — M47816 Spondylosis without myelopathy or radiculopathy, lumbar region: Secondary | ICD-10-CM | POA: Diagnosis not present

## 2020-08-03 DIAGNOSIS — Z880 Allergy status to penicillin: Secondary | ICD-10-CM | POA: Diagnosis not present

## 2020-08-03 DIAGNOSIS — K8020 Calculus of gallbladder without cholecystitis without obstruction: Secondary | ICD-10-CM | POA: Diagnosis not present

## 2020-08-03 DIAGNOSIS — K5900 Constipation, unspecified: Secondary | ICD-10-CM | POA: Diagnosis not present

## 2020-08-04 DIAGNOSIS — I959 Hypotension, unspecified: Secondary | ICD-10-CM | POA: Diagnosis not present

## 2020-08-04 DIAGNOSIS — M47816 Spondylosis without myelopathy or radiculopathy, lumbar region: Secondary | ICD-10-CM | POA: Diagnosis not present

## 2020-08-04 DIAGNOSIS — I700 Atherosclerosis of aorta: Secondary | ICD-10-CM | POA: Diagnosis not present

## 2020-08-04 DIAGNOSIS — K8020 Calculus of gallbladder without cholecystitis without obstruction: Secondary | ICD-10-CM | POA: Diagnosis not present

## 2020-08-04 DIAGNOSIS — I2510 Atherosclerotic heart disease of native coronary artery without angina pectoris: Secondary | ICD-10-CM | POA: Diagnosis not present

## 2020-08-04 DIAGNOSIS — N200 Calculus of kidney: Secondary | ICD-10-CM | POA: Diagnosis not present

## 2020-08-08 DIAGNOSIS — Z6828 Body mass index (BMI) 28.0-28.9, adult: Secondary | ICD-10-CM | POA: Diagnosis not present

## 2020-08-08 DIAGNOSIS — I959 Hypotension, unspecified: Secondary | ICD-10-CM | POA: Diagnosis not present

## 2020-08-12 DIAGNOSIS — I959 Hypotension, unspecified: Secondary | ICD-10-CM | POA: Diagnosis not present

## 2020-09-05 DIAGNOSIS — E785 Hyperlipidemia, unspecified: Secondary | ICD-10-CM | POA: Diagnosis not present

## 2020-09-05 DIAGNOSIS — R0600 Dyspnea, unspecified: Secondary | ICD-10-CM | POA: Diagnosis not present

## 2020-09-05 DIAGNOSIS — R9431 Abnormal electrocardiogram [ECG] [EKG]: Secondary | ICD-10-CM | POA: Diagnosis not present

## 2020-09-05 DIAGNOSIS — I952 Hypotension due to drugs: Secondary | ICD-10-CM | POA: Diagnosis not present

## 2020-09-05 DIAGNOSIS — E1143 Type 2 diabetes mellitus with diabetic autonomic (poly)neuropathy: Secondary | ICD-10-CM | POA: Diagnosis not present

## 2020-09-13 ENCOUNTER — Ambulatory Visit: Payer: Medicare HMO | Admitting: Urology

## 2020-09-14 ENCOUNTER — Telehealth: Payer: Self-pay | Admitting: Neurology

## 2020-09-14 ENCOUNTER — Ambulatory Visit: Payer: Medicare HMO | Admitting: Neurology

## 2020-09-14 NOTE — Telephone Encounter (Signed)
This patient canceled the same day of a revisit appointment. 

## 2020-09-19 DIAGNOSIS — R0600 Dyspnea, unspecified: Secondary | ICD-10-CM | POA: Diagnosis not present

## 2020-09-19 DIAGNOSIS — I952 Hypotension due to drugs: Secondary | ICD-10-CM | POA: Diagnosis not present

## 2020-09-19 DIAGNOSIS — R9431 Abnormal electrocardiogram [ECG] [EKG]: Secondary | ICD-10-CM | POA: Diagnosis not present

## 2020-09-26 ENCOUNTER — Other Ambulatory Visit: Payer: Self-pay | Admitting: Neurology

## 2020-10-03 DIAGNOSIS — E119 Type 2 diabetes mellitus without complications: Secondary | ICD-10-CM | POA: Diagnosis not present

## 2020-10-03 DIAGNOSIS — I959 Hypotension, unspecified: Secondary | ICD-10-CM | POA: Diagnosis not present

## 2020-10-03 DIAGNOSIS — E0843 Diabetes mellitus due to underlying condition with diabetic autonomic (poly)neuropathy: Secondary | ICD-10-CM | POA: Diagnosis not present

## 2020-10-03 DIAGNOSIS — Z6829 Body mass index (BMI) 29.0-29.9, adult: Secondary | ICD-10-CM | POA: Diagnosis not present

## 2020-10-03 DIAGNOSIS — I10 Essential (primary) hypertension: Secondary | ICD-10-CM | POA: Diagnosis not present

## 2020-10-04 DIAGNOSIS — I2510 Atherosclerotic heart disease of native coronary artery without angina pectoris: Secondary | ICD-10-CM | POA: Diagnosis not present

## 2020-10-04 DIAGNOSIS — I952 Hypotension due to drugs: Secondary | ICD-10-CM | POA: Diagnosis not present

## 2020-10-04 DIAGNOSIS — R9431 Abnormal electrocardiogram [ECG] [EKG]: Secondary | ICD-10-CM | POA: Diagnosis not present

## 2020-10-04 DIAGNOSIS — E785 Hyperlipidemia, unspecified: Secondary | ICD-10-CM | POA: Diagnosis not present

## 2020-11-03 ENCOUNTER — Telehealth: Payer: Self-pay | Admitting: Neurology

## 2020-11-03 MED ORDER — GABAPENTIN 800 MG PO TABS: 800.0000 mg | ORAL_TABLET | Freq: Three times a day (TID) | ORAL | 1 refills | Status: DC

## 2020-11-03 NOTE — Telephone Encounter (Signed)
I called the patient.  He has had a lot of neuropathy pain still, he is on 2000 mg of gabapentin and on Cymbalta taking 60 mg twice daily.  We will increase the gabapentin to the 800 mg tablets taking 1 capsule 3 times daily.

## 2020-11-03 NOTE — Telephone Encounter (Signed)
Pt called wanting to know if his gabapentin (NEURONTIN) 400 MG capsule can be increased due to him having a lot of Neuropathy pain. Please advise.

## 2020-11-07 MED ORDER — GABAPENTIN 800 MG PO TABS: 800.0000 mg | ORAL_TABLET | Freq: Three times a day (TID) | ORAL | 1 refills | Status: AC

## 2020-11-07 NOTE — Telephone Encounter (Signed)
Pt cancelled gabapentin prescription with [facility]. Need prescription sent to [facility].  Can call back if have any questions.

## 2020-11-07 NOTE — Telephone Encounter (Signed)
Gabapentin Rx discontinued with [facility], sent to [facility]) mail pharmacy.

## 2020-11-07 NOTE — Addendum Note (Signed)
Addended by: Florian Buff C on: 11/07/2020 11:05 AM   Modules accepted: Orders

## 2020-12-09 ENCOUNTER — Encounter: Payer: Self-pay | Admitting: Neurology

## 2020-12-09 ENCOUNTER — Ambulatory Visit: Payer: Medicare HMO | Admitting: Neurology

## 2020-12-09 VITALS — BP 113/73 | HR 83 | Ht 74.0 in | Wt 232.0 lb

## 2020-12-09 DIAGNOSIS — R269 Unspecified abnormalities of gait and mobility: Secondary | ICD-10-CM

## 2020-12-09 DIAGNOSIS — E1142 Type 2 diabetes mellitus with diabetic polyneuropathy: Secondary | ICD-10-CM

## 2020-12-09 DIAGNOSIS — G8929 Other chronic pain: Secondary | ICD-10-CM

## 2020-12-09 DIAGNOSIS — M5441 Lumbago with sciatica, right side: Secondary | ICD-10-CM

## 2020-12-09 NOTE — Progress Notes (Signed)
Reason for visit: Chronic neck and low back pain, lumbar spinal stenosis, diabetic peripheral neuropathy  Joshua Perez is an 70 y.o. male  History of present illness:  Joshua Perez is a 70 year old left-handed white male with a history of diabetes associated with a diabetic peripheral neuropathy.  The patient has chronic low back pain with neuro claudication symptoms that go down both legs when walking.  The patient is limited to about 150 feet of walking before he has to rest.  He has moderate to severe spinal stenosis at the L4-5 level.  The patient also has chronic neck pain with severe chronic muscle spasm and cervicogenic headache.  He denies any pain down the arms on either side.  He has some neuroforaminal stenosis at the C5-6 levels on both sides.  He has not responded to medical therapy, he does get physical therapy at times but with no significant benefit.  Epidural steroid injections in the neck and low back have not offered any significant duration of relief of pain.  The patient walks with a walker, he has fallen on occasion, the last fall was about 2 months ago, he fell backwards and struck the back of his head.  He comes in for further evaluation today.  Past Medical History:  Diagnosis Date   Abnormality of gait 09/15/2015   Anxiety    Arthritis    Asthma    Bipolar disorder ([place]) 1965   Onset in childhood; remote history of ECT   Chronic low back pain 12/10/2019   Depression    Diabetes mellitus, type 2 ([place]) 1980   Recent hemoglobin A1c reportedly 5.3; controlled with single oral agent   Diabetic peripheral neuropathy ([place]) 09/15/2015   Erectile dysfunction    High cholesterol    Hypertension 1995   Hypothyroidism    Imbalance    Joint pain    Palpitations    Echocardiogram normal in 07/2007 except for mild LVH; Event recorder-first degree AV block, no symptomatic spells, no significant arrhythmias; stress nuclear negative in 1996   Syncope and collapse 09/15/2015     Past Surgical History:  Procedure Laterality Date   APPENDECTOMY     BIOPSY  09/21/2011   NOT DONE. Unable to remove from [facility] list   CHOLECYSTECTOMY     COLONOSCOPY  09/21/2011   RMR: Colonic polyps-treated/removed as described above/ Colonic diverticulosis. Lax sphincter time. Status post segmental biopsies(TUBULOVILLOUS ADENOMA/TUBULAR ADENOMA). Prep marginal. TCS 3 months recommended   COLONOSCOPY  01/18/2012   RMR: tubular adenoma. Surveillance due Dec 2016   COLONOSCOPY W/ POLYPECTOMY  2002   Dr. Norberto Sorenson Stark-->15mm sessile trv colon polyp, path unavailable   [place]   , right inguinal   [place]   ?  Subsequent reversal   VASECTOMY REVERSAL      Family History  Problem Relation Age of Onset   Diabetes Father    Heart attack Father    Alzheimer's disease Mother    Diabetes Paternal Grandfather    Heart disease Paternal Grandfather    Colon cancer Neg Hx    Liver disease Neg Hx    Inflammatory bowel disease Neg Hx     Social history:  reports that he has never smoked. He has never used smokeless tobacco. He reports that he does not drink alcohol and does not use drugs.    Allergies  Allergen Reactions   Bee Venom  Anaphylaxis   Penicillins Shortness Of Breath, Swelling and Rash    Medications:  Prior to Admission medications   Medication Sig Start Date End Date Taking? Authorizing Provider  ACCU-CHEK AVIVA PLUS test strip  04/11/19  Yes [provider]  Accu-Chek FastClix Lancets [place]  05/15/19  Yes [provider]  atorvastatin (LIPITOR) 40 MG tablet  06/12/19  Yes [provider]  DULoxetine (CYMBALTA) 60 MG capsule Take 60 mg by mouth in the morning and at bedtime. 05/15/19  Yes [provider]  EPINEPHrine (EPIPEN 2-PAK) 0.3 mg/0.3 mL SOAJ injection Inject 0.3 mLs (0.3 mg total) into the muscle once. 11/03/12  Yes Mikey Kirschner, MD  fludrocortisone  (FLORINEF) 0.1 MG tablet Take 0.1 mg by mouth daily.   Yes [provider]  gabapentin (NEURONTIN) 800 MG tablet Take 1 tablet (800 mg total) by mouth 3 (three) times daily. 11/07/20  Yes Kathrynn Ducking, MD  glipiZIDE (GLUCOTROL) 5 MG tablet  03/30/19  Yes [provider]  levothyroxine (SYNTHROID, LEVOTHROID) 75 MCG tablet Take 1 tablet (75 mcg total) by mouth daily. 04/22/13  Yes Mikey Kirschner, MD  Magnesium 400 MG TABS Take 2 tablets by mouth 2 (two) times daily.  08/13/15  Yes [provider]  metFORMIN (GLUCOPHAGE) 1000 MG tablet Take 1,000 mg by mouth 2 (two) times daily with a meal.   Yes [provider]  tiZANidine (ZANAFLEX) 4 MG tablet Take 1 tablet (4 mg total) by mouth in the morning, at noon, in the evening, and at bedtime. 04/25/20  Yes Kathrynn Ducking, MD  zolpidem (AMBIEN) 10 MG tablet TAKE 1 TABLET BY MOUTH AT BEDTIME AS NEEDED FOR SLEEP 09/28/20  Yes Kathrynn Ducking, MD    ROS:  Out of a complete 14 system review of symptoms, the patient complains only of the following symptoms, and all other reviewed systems are negative.  Walking difficulty Chronic neck stiffness and pain Headache Low back pain, leg pain  Blood pressure 113/73, pulse 83, height 6\' 2"  (1.88 m), weight 232 lb (105.2 kg).  Physical Exam  General: The patient is alert and cooperative at the time of the examination.  Neuromuscular: The patient has severe range of movement limitation of the neck, only able to rotate the head 5 to 10 degrees bilaterally.  Skin: No significant peripheral edema is noted.   Neurologic Exam  Mental status: The patient is alert and oriented x 3 at the time of the examination. The patient has apparent normal recent and remote memory, with an apparently normal attention span and concentration ability.   Cranial nerves: Facial symmetry is present. Speech is normal, no aphasia or dysarthria is noted. Extraocular movements are full. Visual  fields are full.  Motor: The patient has good strength in all 4 extremities.  Sensory examination: Soft touch sensation is symmetric on the face, arms, and legs.  Coordination: The patient has good finger-nose-finger and heel-to-shin bilaterally.  Gait and station: The patient has a somewhat wide-based gait, he can walk short distances without the walker, but normally uses a walker for ambulation.  Tandem gait was not attempted.  Reflexes: Deep tendon reflexes are symmetric.   Assessment/Plan:  1.  Cervical spondylosis, chronic neck pain  2.  Cervicogenic headache  3.  Lumbar spinal stenosis with neurogenic claudication  4.  Diabetic peripheral neuropathy  The patient has significant underlying discomfort and limitation of movement of the neck and low back.  It is possible his back issues  could be amenable to surgery, I am less certain that surgery would help his neck issues.  In the long run, he may require a pain center referral.  I will set up a neurosurgical consultation.  He will follow-up here in 3 to 4 months to see Dr. Jaynee Eagles.  He is on tizanidine that he just recently restarted, if this is not effective for the neck and shoulders, would consider potentially adding baclofen in low-dose.  Jill Alexanders MD 12/09/2020 11:24 AM  [facility] [address] [place], [place] [address]  Phone [phone] Fax [phone]

## 2020-12-12 ENCOUNTER — Telehealth: Payer: Self-pay | Admitting: Neurology

## 2020-12-12 NOTE — Telephone Encounter (Signed)
Sent to [facility] ph # [phone]. 

## 2020-12-15 DIAGNOSIS — Z6829 Body mass index (BMI) 29.0-29.9, adult: Secondary | ICD-10-CM | POA: Diagnosis not present

## 2020-12-15 DIAGNOSIS — I10 Essential (primary) hypertension: Secondary | ICD-10-CM | POA: Diagnosis not present

## 2020-12-15 DIAGNOSIS — M4802 Spinal stenosis, cervical region: Secondary | ICD-10-CM | POA: Diagnosis not present

## 2020-12-15 DIAGNOSIS — M48062 Spinal stenosis, lumbar region with neurogenic claudication: Secondary | ICD-10-CM | POA: Diagnosis not present

## 2020-12-20 ENCOUNTER — Other Ambulatory Visit: Payer: Self-pay | Admitting: Neurosurgery

## 2020-12-22 ENCOUNTER — Telehealth: Payer: Self-pay | Admitting: Neurology

## 2020-12-22 NOTE — Telephone Encounter (Signed)
Pt states he takes tiZANidine (ZANAFLEX) 4 MG tablet for his neck and skull pain but he is not getting any relief with the 4 mg.  Please call

## 2020-12-27 ENCOUNTER — Other Ambulatory Visit: Payer: Self-pay | Admitting: Neurology

## 2020-12-27 DIAGNOSIS — R9431 Abnormal electrocardiogram [ECG] [EKG]: Secondary | ICD-10-CM | POA: Diagnosis not present

## 2020-12-27 DIAGNOSIS — I25118 Atherosclerotic heart disease of native coronary artery with other forms of angina pectoris: Secondary | ICD-10-CM | POA: Diagnosis not present

## 2020-12-27 DIAGNOSIS — E785 Hyperlipidemia, unspecified: Secondary | ICD-10-CM | POA: Diagnosis not present

## 2020-12-27 NOTE — Telephone Encounter (Signed)
I called and LMVM for pt and wife that received message.  Appt with Dr. Jaynee Eagles is 04-2021, Dr. Jannifer Franklin placed a referral for NS.  Recommend to see NS then pain management after that if is needed.  Please call back if questions.

## 2020-12-28 ENCOUNTER — Telehealth: Payer: Self-pay | Admitting: Neurology

## 2020-12-28 NOTE — Telephone Encounter (Signed)
Joshua Perez and taylor,   Can you check to see if this patient has gotten an appt with Neurosurgery?

## 2020-12-28 NOTE — Telephone Encounter (Signed)
Please let him know that I do not provide long term Ambien. He was referred to neurosurgery per Dr. Jannifer Franklin' last note and referral was placed. Patient should be transitioned to neurosurgery. Also inquire if he would like a pain management referral. He can discuss Ambien with his primary care physician but I do not prescribe it long term.

## 2020-12-29 NOTE — Telephone Encounter (Signed)
I called patient he was not available.  Spoke to wife.  Had already seen neurosurgery Dr. Kathyrn Sheriff and will be doing  back surgery but she said as they were not able to do neck surgery.  They were okay to refill his tizanidine but not the Ambien. I relayed that Dr. Jaynee Eagles is deferring that to pcp or NS.  So they will check with primary care.  Pain management referral is something they may address after he has his surgery in December.  He will have a stress test done prior to his surgery.  She appreciated call back.

## 2021-01-02 ENCOUNTER — Other Ambulatory Visit: Payer: Self-pay | Admitting: Neurosurgery

## 2021-01-10 DIAGNOSIS — R9431 Abnormal electrocardiogram [ECG] [EKG]: Secondary | ICD-10-CM | POA: Diagnosis not present

## 2021-01-10 DIAGNOSIS — Z01818 Encounter for other preprocedural examination: Secondary | ICD-10-CM | POA: Diagnosis not present

## 2021-01-10 DIAGNOSIS — I25118 Atherosclerotic heart disease of native coronary artery with other forms of angina pectoris: Secondary | ICD-10-CM | POA: Diagnosis not present

## 2021-01-12 NOTE — Progress Notes (Signed)
Surgical Instructions    Your procedure is scheduled on 01/17/21.  Report to [facility] Main Entrance "A" at 5:30 A.M., then check in with the Admitting office.  Call this number if you have problems the morning of surgery:  [phone]   If you have any questions prior to your surgery date call [phone]: Open Monday-Friday 8am-4pm    Remember:  Do not eat or drink after midnight the night before your surgery      Take these medicines the morning of surgery with A SIP OF WATER  atorvastatin (LIPITOR)  DULoxetine (CYMBALTA)  gabapentin (NEURONTIN)  levothyroxine (SYNTHROID, LEVOTHROID) tiZANidine (ZANAFLEX)  As of today, STOP taking any Aspirin (unless otherwise instructed by your surgeon) Aleve, Naproxen, Ibuprofen, Motrin, Advil, Goody's, BC's, all herbal medications, fish oil, and all vitamins.   WHAT DO I DO ABOUT MY DIABETES MEDICATION?   Do not take oral diabetes medicines (pills) the morning of surgery.  THE NIGHT BEFORE SURGERY, do not take glipiZIDE (GLUCOTROL).    THE MORNING OF SURGERY,do not take glipiZIDE (GLUCOTROL) or metFORMIN (GLUCOPHAGE).  The day of surgery, do not take other diabetes injectables, including Byetta (exenatide), Bydureon (exenatide ER), Victoza (liraglutide), or Trulicity (dulaglutide).  If your CBG is greater than 220 mg/dL, you may take  of your sliding scale (correction) dose of insulin.   HOW TO MANAGE YOUR DIABETES BEFORE AND AFTER SURGERY  Why is it important to control my blood sugar before and after surgery? Improving blood sugar levels before and after surgery helps healing and can limit problems. A way of improving blood sugar control is eating a healthy diet by:  Eating less sugar and carbohydrates  Increasing activity/exercise  Talking with your doctor about reaching your blood sugar goals High blood sugars (greater than 180 mg/dL) can raise your risk of infections and slow your recovery, so you will need to focus on  controlling your diabetes during the weeks before surgery. Make sure that the doctor who takes care of your diabetes knows about your planned surgery including the date and location.  How do I manage my blood sugar before surgery? Check your blood sugar at least 4 times a day, starting 2 days before surgery, to make sure that the level is not too high or low.  Check your blood sugar the morning of your surgery when you wake up and every 2 hours until you get to the Short Stay unit.  If your blood sugar is less than 70 mg/dL, you will need to treat for low blood sugar: Do not take insulin. Treat a low blood sugar (less than 70 mg/dL) with  cup of clear juice (cranberry or apple), 4 glucose tablets, OR glucose gel. Recheck blood sugar in 15 minutes after treatment (to make sure it is greater than 70 mg/dL). If your blood sugar is not greater than 70 mg/dL on recheck, call [phone] for further instructions. Report your blood sugar to the short stay nurse when you get to Short Stay.  If you are admitted to the hospital after surgery: Your blood sugar will be checked by the staff and you will probably be given insulin after surgery (instead of oral diabetes medicines) to make sure you have good blood sugar levels. The goal for blood sugar control after surgery is 80-180 mg/dL.   After your COVID test   You are not required to quarantine however you are required to wear a well-fitting mask when you are out and around people not in your household.  If your mask becomes wet or soiled, replace with a new one.  Wash your hands often with soap and water for 20 seconds or clean your hands with an alcohol-based hand sanitizer that contains at least 60% alcohol.  Do not share personal items.  Notify your provider: if you are in close contact with someone who has COVID  or if you develop a fever of 100.4 or greater, sneezing, cough, sore throat, shortness of breath or body aches.              Do not wear jewelry or makeup Do not wear lotions, powders, perfumes/colognes, or deodorant. Do not shave 48 hours prior to surgery.  Men may shave face and neck. Do not bring valuables to the hospital. DO Not wear nail polish, gel polish, artificial nails, or any other type of covering on natural nails including finger and toenails. If patients have artificial nails, gel coating, etc. that need to be removed by a nail salon, please have this removed prior to surgery or surgery may need to be canceled/delayed if the surgeon/ anesthesia feels like the patient is unable to be adequately monitored.             [facility] is not responsible for any belongings or valuables.  Do NOT Smoke (Tobacco/Vaping)  24 hours prior to your procedure  If you use a CPAP at night, you may bring your mask for your overnight stay.   Contacts, glasses, hearing aids, dentures or partials may not be worn into surgery, please bring cases for these belongings   For patients admitted to the hospital, discharge time will be determined by your treatment team.   Patients discharged the day of surgery will not be allowed to drive home, and someone needs to stay with them for 24 hours.  NO VISITORS WILL BE ALLOWED IN PRE-OP WHERE PATIENTS ARE PREPPED FOR SURGERY.  ONLY 1 SUPPORT PERSON MAY BE PRESENT IN THE WAITING ROOM WHILE YOU ARE IN SURGERY.  IF YOU ARE TO BE ADMITTED, ONCE YOU ARE IN YOUR ROOM YOU WILL BE ALLOWED TWO (2) VISITORS. 1 (ONE) VISITOR MAY STAY OVERNIGHT BUT MUST ARRIVE TO THE ROOM BY 8pm.  Minor children may have two parents present. Special consideration for safety and communication needs will be reviewed on a case by case basis.  Special instructions:    Oral Hygiene is also important to reduce your risk of infection.  Remember - BRUSH YOUR TEETH THE MORNING OF SURGERY WITH YOUR REGULAR TOOTHPASTE   [place]- Preparing For Surgery  Before surgery, you can play an important role. Because skin is  not sterile, your skin needs to be as free of germs as possible. You can reduce the number of germs on your skin by washing with CHG (chlorahexidine gluconate) Soap before surgery.  CHG is an antiseptic cleaner which kills germs and bonds with the skin to continue killing germs even after washing.     Please do not use if you have an allergy to CHG or antibacterial soaps. If your skin becomes reddened/irritated stop using the CHG.  Do not shave (including legs and underarms) for at least 48 hours prior to first CHG shower. It is OK to shave your face.  Please follow these instructions carefully.     Shower the NIGHT BEFORE SURGERY and the MORNING OF SURGERY with CHG Soap.   If you chose to wash your hair, wash your hair first as usual with your normal shampoo. After you  shampoo, rinse your hair and body thoroughly to remove the shampoo.  Then [employer] and genitals (private parts) with your normal soap and rinse thoroughly to remove soap.  After that Use CHG Soap as you would any other liquid soap. You can apply CHG directly to the skin and wash gently with a scrungie or a clean washcloth.   Apply the CHG Soap to your body ONLY FROM THE NECK DOWN.  Do not use on open wounds or open sores. Avoid contact with your eyes, ears, mouth and genitals (private parts). Wash Face and genitals (private parts)  with your normal soap.   Wash thoroughly, paying special attention to the area where your surgery will be performed.  Thoroughly rinse your body with warm water from the neck down.  DO NOT shower/wash with your normal soap after using and rinsing off the CHG Soap.  Pat yourself dry with a CLEAN TOWEL.  Wear CLEAN PAJAMAS to bed the night before surgery  Place CLEAN SHEETS on your bed the night before your surgery  DO NOT SLEEP WITH PETS.   Day of Surgery: Take a shower with CHG soap. Wear Clean/Comfortable clothing the morning of surgery Do not apply any deodorants/lotions.   Remember to  brush your teeth WITH YOUR REGULAR TOOTHPASTE.   Please read over the following fact sheets that you were given.

## 2021-01-13 ENCOUNTER — Encounter (HOSPITAL_COMMUNITY)
Admission: RE | Admit: 2021-01-13 | Discharge: 2021-01-13 | Disposition: A | Discharge status: Home / Self Care | Payer: Medicare HMO | Source: Ambulatory Visit | Attending: Neurosurgery | Admitting: Neurosurgery

## 2021-01-13 ENCOUNTER — Other Ambulatory Visit: Payer: Self-pay

## 2021-01-13 ENCOUNTER — Encounter (HOSPITAL_COMMUNITY): Payer: Self-pay

## 2021-01-13 VITALS — BP 118/77 | HR 102 | Temp 98.2°F | Resp 19 | Ht 74.0 in | Wt 237.5 lb

## 2021-01-13 DIAGNOSIS — Z01812 Encounter for preprocedural laboratory examination: Secondary | ICD-10-CM | POA: Diagnosis not present

## 2021-01-13 DIAGNOSIS — I951 Orthostatic hypotension: Secondary | ICD-10-CM | POA: Diagnosis not present

## 2021-01-13 DIAGNOSIS — E119 Type 2 diabetes mellitus without complications: Secondary | ICD-10-CM | POA: Insufficient documentation

## 2021-01-13 DIAGNOSIS — Z20822 Contact with and (suspected) exposure to covid-19: Secondary | ICD-10-CM | POA: Diagnosis not present

## 2021-01-13 DIAGNOSIS — I2510 Atherosclerotic heart disease of native coronary artery without angina pectoris: Secondary | ICD-10-CM | POA: Diagnosis not present

## 2021-01-13 DIAGNOSIS — E785 Hyperlipidemia, unspecified: Secondary | ICD-10-CM | POA: Diagnosis not present

## 2021-01-13 DIAGNOSIS — Z01818 Encounter for other preprocedural examination: Secondary | ICD-10-CM

## 2021-01-13 HISTORY — DX: Cardiac murmur, unspecified: R01.1

## 2021-01-13 HISTORY — DX: Sleep apnea, unspecified: G47.30

## 2021-01-13 HISTORY — DX: Headache, unspecified: R51.9

## 2021-01-13 HISTORY — DX: Other specified postprocedural states: Z98.890

## 2021-01-13 HISTORY — DX: Personal history of urinary calculi: Z87.442

## 2021-01-13 HISTORY — DX: Nausea with vomiting, unspecified: R11.2

## 2021-01-13 HISTORY — DX: Atherosclerotic heart disease of native coronary artery without angina pectoris: I25.10

## 2021-01-13 LAB — CBC
HCT: 47.1 % (ref 39.0–52.0)
Hemoglobin: 15.9 g/dL (ref 13.0–17.0)
MCH: 30.9 pg (ref 26.0–34.0)
MCHC: 33.8 g/dL (ref 30.0–36.0)
MCV: 91.6 fL (ref 80.0–100.0)
Platelets: 284 10*3/uL (ref 150–400)
RBC: 5.14 MIL/uL (ref 4.22–5.81)
RDW: 13.1 % (ref 11.5–15.5)
WBC: 8.9 10*3/uL (ref 4.0–10.5)
nRBC: 0 % (ref 0.0–0.2)

## 2021-01-13 LAB — HEMOGLOBIN A1C
Hgb A1c MFr Bld: 7.2 % — ABNORMAL HIGH (ref 4.8–5.6)
Mean Plasma Glucose: 159.94 mg/dL

## 2021-01-13 LAB — BASIC METABOLIC PANEL
Anion gap: 8 (ref 5–15)
BUN: 24 mg/dL — ABNORMAL HIGH (ref 8–23)
CO2: 27 mmol/L (ref 22–32)
Calcium: 9.8 mg/dL (ref 8.9–10.3)
Chloride: 103 mmol/L (ref 98–111)
Creatinine, Ser: 1.49 mg/dL — ABNORMAL HIGH (ref 0.61–1.24)
GFR, Estimated: 50 mL/min — ABNORMAL LOW (ref >60)
Glucose, Bld: 210 mg/dL — ABNORMAL HIGH (ref 70–99)
Potassium: 5 mmol/L (ref 3.5–5.1)
Sodium: 138 mmol/L (ref 135–145)

## 2021-01-13 LAB — TYPE AND SCREEN
ABO/RH(D): O POS
Antibody Screen: NEGATIVE

## 2021-01-13 LAB — SURGICAL PCR SCREEN
MRSA, PCR: NEGATIVE
Staphylococcus aureus: NEGATIVE

## 2021-01-13 LAB — SARS CORONAVIRUS 2 (TAT 6-24 HRS): SARS Coronavirus 2: NEGATIVE

## 2021-01-13 LAB — GLUCOSE, CAPILLARY: Glucose-Capillary: 233 mg/dL — ABNORMAL HIGH (ref 70–99)

## 2021-01-13 NOTE — Progress Notes (Signed)
PCP - Joshua Perez Cardiologist - Joshua Perez  PPM/ICD - denies   Chest x-ray - n/a EKG - 12/27/20 Stress Test - 01/10/21 ECHO - 09/19/20 Cardiac Cath - denies  Sleep Study - over 20 years ago, normal as far as the patient can remember   Fasting Blood Sugar - 130-150 Checks Blood Sugar 3-4 times a day  As of today, STOP taking any Aspirin (unless otherwise instructed by your surgeon) Aleve, Naproxen, Ibuprofen, Motrin, Advil, Goody's, BC's, all herbal medications, fish oil, and all vitamins.  ERAS Protcol -no   COVID TEST- 01/13/21 in PAT   Anesthesia review: yes, records requested from dr. Candis Perez at [facility]  Patient denies shortness of breath, fever, cough and chest pain at PAT appointment   All instructions explained to the patient, with a verbal understanding of the material. Patient agrees to go over the instructions while at home for a better understanding. Patient also instructed to self quarantine after being tested for COVID-19. The opportunity to ask questions was provided.

## 2021-01-16 MED ORDER — VANCOMYCIN HCL 1500 MG/300ML IV SOLN
1500.0000 mg | INTRAVENOUS | Status: AC
  Administered 2021-01-17: 1500 mg via INTRAVENOUS
  Filled 2021-01-16 (×3): qty 300

## 2021-01-16 NOTE — Progress Notes (Signed)
Anesthesia Chart Review:  Follows with cardiology at [facility] for history of orthostatic hypotension, HLD, coronary calcifications noted on CT scan. Recent echo 09/19/2020 showed EF 65 to 70%, grade 1 DD, normal valves. He was last seen 12/27/2020 for preop evaluation.  Per note, he has mild chronic dyspnea with abnormal EKG.  Stress test was ordered for risk stratification stress test 01/10/2021 was nonischemic, low risk.  Preop labs reviewed, glucose elevated at 233 consistent with history of DM2 (A1c 7.2), creatinine mildly elevated 1.49, labs otherwise unremarkable.  EKG 12/27/2020 (per Dr. Elwyn Reach note in [place]): Sinus rhythm with possible inferior myocardial infarction, nonspecific ST-T, heart rate 88.  Rest/exercise nuclear stress 01/10/2021 (Care Everywhere): Resting ECG  - ECG is abnormal.  - Resting ECG shows no ST-segment deviation.  - Baseline shows normal sinus rhythm  - Non-specific T wave changes noted.   Stress Findings  - A pharmacological stress test was performed using regadenoson 0.4mg  IV The patient with a peak HR of 101 bpm (68 % of MPHR)  - The patient reported no symptoms during the stress test.   Stress ECG  - No significant ST segment changes were noted during stress or recovery.  - There were no arrhythmias during stress.  - Non-diagnostic EKG portion of Lexiscan SPECT study  Nuclear perfusion impression: 1. No reversible ischemia or infarction.   2. Normal left ventricular wall motion.   3. Left ventricular ejection fraction 65%   4. Non invasive risk stratification*: Low   TTE 09/19/2020: Summary    1. The left ventricle is normal in size with mildly increased wall  thickness.    2. The left ventricular systolic function is normal, LVEF is visually  estimated at 65-70%.    3. There is grade I diastolic dysfunction (impaired relaxation).    4. The left atrium is mildly dilated in size.    5. The right ventricle is normal in size, with normal  systolic function.    6. IVC size and inspiratory change suggest low right atrial pressure. (<3  mmHg).      Wynonia Musty [facility] Short Stay Center/Anesthesiology Phone [phone] 01/16/2021 9:12 AM

## 2021-01-16 NOTE — Anesthesia Preprocedure Evaluation (Addendum)
Anesthesia Evaluation  Patient identified by MRN, date of birth, ID band Patient awake    Reviewed: Allergy & Precautions, NPO status , Patient's Chart, lab work & pertinent test results  History of Anesthesia Complications (+) PONV  Airway Mallampati: II  TM Distance: >3 FB Neck ROM: Full    Dental  (+) Dental Advisory Given   Pulmonary sleep apnea (does not use CPAP) ,  01/13/2021 SARS coronavirus NEG   breath sounds clear to auscultation       Cardiovascular hypertension, (-) angina+ CAD   Rhythm:Regular Rate:Normal  09/2020 ECHO: EF 60-70%, normal LVF with Grade 1 DD, no significant valvular abnormalities 12/2020 Stress: non-ischemic, low risk, EF 65%   Neuro/Psych  Headaches, Anxiety Depression Bipolar Disorder Back pain    GI/Hepatic negative GI ROS, Neg liver ROS,   Endo/Other  diabetes (glu 216), Oral Hypoglycemic AgentsHypothyroidism obese  Renal/GU negative Renal ROS     Musculoskeletal  (+) Arthritis ,   Abdominal   Peds  Hematology   Anesthesia Other Findings   Reproductive/Obstetrics                           Anesthesia Physical Anesthesia Plan  ASA: 3  Anesthesia Plan: General   Post-op Pain Management: Tylenol PO (pre-op) and Dilaudid IV   Induction: Intravenous  PONV Risk Score and Plan: 3 and Ondansetron, Dexamethasone and Treatment may vary due to age or medical condition  Airway Management Planned: Oral ETT  Additional Equipment: None  Intra-op Plan:   Post-operative Plan: Extubation in OR  Informed Consent: I have reviewed the patients History and Physical, chart, labs and discussed the procedure including the risks, benefits and alternatives for the proposed anesthesia with the patient or authorized representative who has indicated his/her understanding and acceptance.     Dental advisory given  Plan Discussed with: CRNA and Surgeon  Anesthesia Plan  Comments: (PAT note by Karoline Caldwell, PA-C: Follows with cardiology at [facility] for history of orthostatic hypotension, HLD, coronary calcifications noted on CT scan. Recent echo 09/19/2020 showed EF 65 to 70%, grade 1 DD, normal valves. He was last seen 12/27/2020 for preop evaluation.  Per note, he has mild chronic dyspnea with abnormal EKG.  Stress test was ordered for risk stratification stress test 01/10/2021 was nonischemic, low risk.  Preop labs reviewed, glucose elevated at 233 consistent with history of DM2 (A1c 7.2), creatinine mildly elevated 1.49, labs otherwise unremarkable.  EKG 12/27/2020 (per Dr. Elwyn Reach note in [place]): Sinus rhythm with possible inferior myocardial infarction, nonspecific ST-T, heart rate 88.  Rest/exercise nuclear stress 01/10/2021 (Care Everywhere): Resting ECG  - ECG is abnormal.  - Resting ECG shows no ST-segment deviation.  - Baseline shows normal sinus rhythm  - Non-specific T wave changes noted.   Stress Findings  - A pharmacological stress test was performed using regadenoson 0.4mg  IV The patient with a peak HR of 101 bpm (68 % of MPHR)  - The patient reported no symptoms during the stress test.   Stress ECG  - No significant ST segment changes were noted during stress or recovery.  - There were no arrhythmias during stress.  - Non-diagnostic EKG portion of Lexiscan SPECT study  Nuclear perfusion impression: 1. No reversible ischemia or infarction.   2. Normal left ventricular wall motion.   3. Left ventricular ejection fraction 65%   4. Non invasive risk stratification*: Low   TTE 09/19/2020: Summary  1. The left ventricle  is normal in size with mildly increased wall  thickness.  2. The left ventricular systolic function is normal, LVEF is visually  estimated at 65-70%.  3. There is grade I diastolic dysfunction (impaired relaxation).  4. The left atrium is mildly dilated in size.  5. The right ventricle is normal in size,  with normal systolic function.  6. IVC size and inspiratory change suggest low right atrial pressure. (<3  mmHg).    )      Anesthesia Quick Evaluation

## 2021-01-17 ENCOUNTER — Encounter (HOSPITAL_COMMUNITY)
Admission: RE | Disposition: A | Discharge status: Home / Self Care | Payer: Self-pay | Source: Home / Self Care | Attending: Neurosurgery

## 2021-01-17 ENCOUNTER — Encounter (HOSPITAL_COMMUNITY): Payer: Self-pay | Admitting: Neurosurgery

## 2021-01-17 ENCOUNTER — Observation Stay (HOSPITAL_COMMUNITY)
Admission: RE | Admit: 2021-01-17 | Discharge: 2021-01-18 | Disposition: A | Discharge status: Home / Self Care | Payer: Medicare HMO | Attending: Neurosurgery | Admitting: Neurosurgery

## 2021-01-17 ENCOUNTER — Ambulatory Visit (HOSPITAL_COMMUNITY): Payer: Medicare HMO | Admitting: Physician Assistant

## 2021-01-17 ENCOUNTER — Ambulatory Visit (HOSPITAL_COMMUNITY): Payer: Medicare HMO | Admitting: Anesthesiology

## 2021-01-17 ENCOUNTER — Other Ambulatory Visit: Payer: Self-pay

## 2021-01-17 ENCOUNTER — Ambulatory Visit (HOSPITAL_COMMUNITY): Payer: Medicare HMO

## 2021-01-17 DIAGNOSIS — M4306 Spondylolysis, lumbar region: Secondary | ICD-10-CM | POA: Diagnosis present

## 2021-01-17 DIAGNOSIS — Z79899 Other long term (current) drug therapy: Secondary | ICD-10-CM | POA: Diagnosis not present

## 2021-01-17 DIAGNOSIS — I10 Essential (primary) hypertension: Secondary | ICD-10-CM | POA: Insufficient documentation

## 2021-01-17 DIAGNOSIS — Z419 Encounter for procedure for purposes other than remedying health state, unspecified: Secondary | ICD-10-CM

## 2021-01-17 DIAGNOSIS — M47816 Spondylosis without myelopathy or radiculopathy, lumbar region: Secondary | ICD-10-CM | POA: Diagnosis not present

## 2021-01-17 DIAGNOSIS — M47896 Other spondylosis, lumbar region: Secondary | ICD-10-CM | POA: Insufficient documentation

## 2021-01-17 DIAGNOSIS — Z8679 Personal history of other diseases of the circulatory system: Secondary | ICD-10-CM | POA: Insufficient documentation

## 2021-01-17 DIAGNOSIS — E119 Type 2 diabetes mellitus without complications: Secondary | ICD-10-CM | POA: Diagnosis not present

## 2021-01-17 DIAGNOSIS — M48062 Spinal stenosis, lumbar region with neurogenic claudication: Principal | ICD-10-CM | POA: Insufficient documentation

## 2021-01-17 DIAGNOSIS — E039 Hypothyroidism, unspecified: Secondary | ICD-10-CM | POA: Insufficient documentation

## 2021-01-17 DIAGNOSIS — I25119 Atherosclerotic heart disease of native coronary artery with unspecified angina pectoris: Secondary | ICD-10-CM | POA: Diagnosis not present

## 2021-01-17 DIAGNOSIS — Z7984 Long term (current) use of oral hypoglycemic drugs: Secondary | ICD-10-CM | POA: Diagnosis not present

## 2021-01-17 DIAGNOSIS — M4326 Fusion of spine, lumbar region: Secondary | ICD-10-CM | POA: Diagnosis not present

## 2021-01-17 DIAGNOSIS — M5416 Radiculopathy, lumbar region: Secondary | ICD-10-CM | POA: Diagnosis not present

## 2021-01-17 DIAGNOSIS — Z981 Arthrodesis status: Secondary | ICD-10-CM | POA: Diagnosis not present

## 2021-01-17 LAB — GLUCOSE, CAPILLARY
Glucose-Capillary: 216 mg/dL — ABNORMAL HIGH (ref 70–99)
Glucose-Capillary: 175 mg/dL — ABNORMAL HIGH (ref 70–99)
Glucose-Capillary: 264 mg/dL — ABNORMAL HIGH (ref 70–99)
Glucose-Capillary: 231 mg/dL — ABNORMAL HIGH (ref 70–99)

## 2021-01-17 LAB — ABO/RH: ABO/RH(D): O POS

## 2021-01-17 SURGERY — POSTERIOR LUMBAR FUSION 1 LEVEL
Anesthesia: General | Site: Spine Lumbar

## 2021-01-17 MED ORDER — VANCOMYCIN HCL 1000 MG/200ML IV SOLN
1000.0000 mg | Freq: Once | INTRAVENOUS | Status: AC
  Administered 2021-01-17: 1000 mg via INTRAVENOUS
  Filled 2021-01-17: qty 200

## 2021-01-17 MED ORDER — ZOLPIDEM TARTRATE 5 MG PO TABS: 5.0000 mg | ORAL_TABLET | Freq: Every evening | ORAL | Status: DC | PRN

## 2021-01-17 MED ORDER — LIDOCAINE 2% (20 MG/ML) 5 ML SYRINGE
INTRAMUSCULAR | Status: DC | PRN
  Administered 2021-01-17: 40 mg via INTRAVENOUS

## 2021-01-17 MED ORDER — ONDANSETRON HCL 4 MG/2ML IJ SOLN: 4.0000 mg | Freq: Four times a day (QID) | INTRAMUSCULAR | Status: DC | PRN

## 2021-01-17 MED ORDER — MENTHOL 3 MG MT LOZG: 1.0000 | LOZENGE | OROMUCOSAL | Status: DC | PRN

## 2021-01-17 MED ORDER — SODIUM CHLORIDE 0.9% FLUSH
3.0000 mL | Freq: Two times a day (BID) | INTRAVENOUS | Status: DC
  Administered 2021-01-17 (×2): 3 mL via INTRAVENOUS

## 2021-01-17 MED ORDER — TIZANIDINE HCL 4 MG PO TABS: 4.0000 mg | ORAL_TABLET | Freq: Three times a day (TID) | ORAL | Status: DC | PRN

## 2021-01-17 MED ORDER — OXYCODONE HCL 5 MG PO TABS: 5.0000 mg | ORAL_TABLET | ORAL | Status: DC | PRN

## 2021-01-17 MED ORDER — PANTOPRAZOLE SODIUM 40 MG IV SOLR: 40.0000 mg | Freq: Every day | INTRAVENOUS | Status: DC

## 2021-01-17 MED ORDER — MIDAZOLAM HCL 2 MG/2ML IJ SOLN: 0.5000 mg | Freq: Once | INTRAMUSCULAR | Status: DC | PRN

## 2021-01-17 MED ORDER — LEVOTHYROXINE SODIUM 75 MCG PO TABS
75.0000 ug | ORAL_TABLET | Freq: Every day | ORAL | Status: DC
Start: 2021-01-18 — End: 2021-01-18
  Administered 2021-01-18: 75 ug via ORAL
  Filled 2021-01-17: qty 1

## 2021-01-17 MED ORDER — ORAL CARE MOUTH RINSE: 15.0000 mL | Freq: Once | OROMUCOSAL | Status: AC

## 2021-01-17 MED ORDER — HYDROMORPHONE HCL 1 MG/ML IJ SOLN
INTRAMUSCULAR | Status: AC
  Filled 2021-01-17: qty 1

## 2021-01-17 MED ORDER — CHLORHEXIDINE GLUCONATE 0.12 % MT SOLN
OROMUCOSAL | Status: AC
  Filled 2021-01-17: qty 15

## 2021-01-17 MED ORDER — DULOXETINE HCL 30 MG PO CPEP
60.0000 mg | ORAL_CAPSULE | Freq: Two times a day (BID) | ORAL | Status: DC
  Administered 2021-01-17 – 2021-01-18 (×2): 60 mg via ORAL
  Filled 2021-01-17 (×2): qty 2

## 2021-01-17 MED ORDER — FENTANYL CITRATE (PF) 250 MCG/5ML IJ SOLN
INTRAMUSCULAR | Status: DC | PRN
  Administered 2021-01-17: 50 ug via INTRAVENOUS
  Administered 2021-01-17: 250 ug via INTRAVENOUS
  Administered 2021-01-17: 100 ug via INTRAVENOUS

## 2021-01-17 MED ORDER — SODIUM CHLORIDE 0.9 % IV SOLN: 250.0000 mL | INTRAVENOUS | Status: DC

## 2021-01-17 MED ORDER — THROMBIN 5000 UNITS EX SOLR
OROMUCOSAL | Status: DC | PRN
  Administered 2021-01-17: 5 mL via TOPICAL

## 2021-01-17 MED ORDER — ONDANSETRON HCL 4 MG/2ML IJ SOLN
INTRAMUSCULAR | Status: DC | PRN
  Administered 2021-01-17: 4 mg via INTRAVENOUS

## 2021-01-17 MED ORDER — PHENOL 1.4 % MT LIQD: 1.0000 | OROMUCOSAL | Status: DC | PRN

## 2021-01-17 MED ORDER — FENTANYL CITRATE (PF) 250 MCG/5ML IJ SOLN
INTRAMUSCULAR | Status: AC
  Filled 2021-01-17: qty 5

## 2021-01-17 MED ORDER — EPINEPHRINE 0.3 MG/0.3ML IJ SOAJ: 0.3000 mg | Freq: Once | INTRAMUSCULAR | Status: DC

## 2021-01-17 MED ORDER — DEXMEDETOMIDINE (PRECEDEX) IN NS 20 MCG/5ML (4 MCG/ML) IV SYRINGE
PREFILLED_SYRINGE | INTRAVENOUS | Status: DC | PRN
  Administered 2021-01-17: 20 ug via INTRAVENOUS

## 2021-01-17 MED ORDER — HYDROMORPHONE HCL 1 MG/ML IJ SOLN
0.2500 mg | INTRAMUSCULAR | Status: DC | PRN
  Administered 2021-01-17: 0.25 mg via INTRAVENOUS

## 2021-01-17 MED ORDER — METFORMIN HCL 500 MG PO TABS
1000.0000 mg | ORAL_TABLET | Freq: Two times a day (BID) | ORAL | Status: DC
  Administered 2021-01-17 – 2021-01-18 (×2): 1000 mg via ORAL
  Filled 2021-01-17 (×2): qty 2

## 2021-01-17 MED ORDER — PROPOFOL 10 MG/ML IV BOLUS
INTRAVENOUS | Status: DC | PRN
  Administered 2021-01-17: 120 mg via INTRAVENOUS
  Administered 2021-01-17: 80 mg via INTRAVENOUS

## 2021-01-17 MED ORDER — ROCURONIUM BROMIDE 10 MG/ML (PF) SYRINGE
PREFILLED_SYRINGE | INTRAVENOUS | Status: DC | PRN
  Administered 2021-01-17: 60 mg via INTRAVENOUS
  Administered 2021-01-17 (×2): 25 mg via INTRAVENOUS

## 2021-01-17 MED ORDER — CHLORHEXIDINE GLUCONATE CLOTH 2 % EX PADS: 6.0000 | MEDICATED_PAD | Freq: Once | CUTANEOUS | Status: DC

## 2021-01-17 MED ORDER — OXYCODONE HCL 5 MG PO TABS
10.0000 mg | ORAL_TABLET | ORAL | Status: DC | PRN
  Administered 2021-01-17 – 2021-01-18 (×6): 10 mg via ORAL
  Filled 2021-01-17 (×6): qty 2

## 2021-01-17 MED ORDER — OXYCODONE HCL 5 MG PO TABS
5.0000 mg | ORAL_TABLET | Freq: Once | ORAL | Status: AC | PRN
  Administered 2021-01-17: 5 mg via ORAL

## 2021-01-17 MED ORDER — SENNA 8.6 MG PO TABS
1.0000 | ORAL_TABLET | Freq: Two times a day (BID) | ORAL | Status: DC
  Administered 2021-01-17 – 2021-01-18 (×3): 8.6 mg via ORAL
  Filled 2021-01-17 (×3): qty 1

## 2021-01-17 MED ORDER — BUPIVACAINE HCL (PF) 0.5 % IJ SOLN
INTRAMUSCULAR | Status: DC | PRN
  Administered 2021-01-17: 5 mL

## 2021-01-17 MED ORDER — LIDOCAINE-EPINEPHRINE 1 %-1:100000 IJ SOLN
INTRAMUSCULAR | Status: AC
  Filled 2021-01-17: qty 1

## 2021-01-17 MED ORDER — INSULIN ASPART 100 UNIT/ML IJ SOLN
0.0000 [IU] | Freq: Three times a day (TID) | INTRAMUSCULAR | Status: DC
Start: 2021-01-17 — End: 2021-01-18
  Administered 2021-01-18: 5 [IU] via SUBCUTANEOUS

## 2021-01-17 MED ORDER — LIDOCAINE-EPINEPHRINE 1 %-1:100000 IJ SOLN
INTRAMUSCULAR | Status: DC | PRN
  Administered 2021-01-17: 5 mL

## 2021-01-17 MED ORDER — MORPHINE SULFATE (PF) 2 MG/ML IV SOLN: 2.0000 mg | INTRAVENOUS | Status: DC | PRN

## 2021-01-17 MED ORDER — BISACODYL 10 MG RE SUPP: 10.0000 mg | Freq: Every day | RECTAL | Status: DC | PRN

## 2021-01-17 MED ORDER — LACTATED RINGERS IV SOLN: INTRAVENOUS | Status: DC

## 2021-01-17 MED ORDER — PHENYLEPHRINE HCL-NACL 20-0.9 MG/250ML-% IV SOLN
INTRAVENOUS | Status: DC | PRN
  Administered 2021-01-17: 25 ug/min via INTRAVENOUS

## 2021-01-17 MED ORDER — PROMETHAZINE HCL 25 MG/ML IJ SOLN: 6.2500 mg | INTRAMUSCULAR | Status: DC | PRN

## 2021-01-17 MED ORDER — ACETAMINOPHEN 500 MG PO TABS
1000.0000 mg | ORAL_TABLET | Freq: Once | ORAL | Status: AC
  Administered 2021-01-17: 1000 mg via ORAL

## 2021-01-17 MED ORDER — OXYCODONE HCL 5 MG PO TABS
ORAL_TABLET | ORAL | Status: AC
  Filled 2021-01-17: qty 1

## 2021-01-17 MED ORDER — ACETAMINOPHEN 325 MG PO TABS
650.0000 mg | ORAL_TABLET | ORAL | Status: DC | PRN
  Administered 2021-01-17: 650 mg via ORAL
  Filled 2021-01-17: qty 2

## 2021-01-17 MED ORDER — METFORMIN HCL 500 MG PO TABS: 1000.0000 mg | ORAL_TABLET | Freq: Two times a day (BID) | ORAL | Status: DC

## 2021-01-17 MED ORDER — SODIUM CHLORIDE 0.9% FLUSH: 3.0000 mL | INTRAVENOUS | Status: DC | PRN

## 2021-01-17 MED ORDER — GLIPIZIDE 5 MG PO TABS
5.0000 mg | ORAL_TABLET | Freq: Two times a day (BID) | ORAL | Status: DC
  Administered 2021-01-17 – 2021-01-18 (×2): 5 mg via ORAL
  Filled 2021-01-17 (×2): qty 1

## 2021-01-17 MED ORDER — METHOCARBAMOL 500 MG PO TABS
500.0000 mg | ORAL_TABLET | Freq: Four times a day (QID) | ORAL | Status: DC | PRN
  Administered 2021-01-17 (×2): 500 mg via ORAL
  Filled 2021-01-17 (×3): qty 1

## 2021-01-17 MED ORDER — SODIUM CHLORIDE 0.9 % IV SOLN: INTRAVENOUS | Status: DC

## 2021-01-17 MED ORDER — METHOCARBAMOL 1000 MG/10ML IJ SOLN
500.0000 mg | Freq: Four times a day (QID) | INTRAVENOUS | Status: DC | PRN
  Filled 2021-01-17: qty 5

## 2021-01-17 MED ORDER — ACETAMINOPHEN 650 MG RE SUPP: 650.0000 mg | RECTAL | Status: DC | PRN

## 2021-01-17 MED ORDER — ACETAMINOPHEN 500 MG PO TABS
ORAL_TABLET | ORAL | Status: AC
  Filled 2021-01-17: qty 2

## 2021-01-17 MED ORDER — 0.9 % SODIUM CHLORIDE (POUR BTL) OPTIME
TOPICAL | Status: DC | PRN
  Administered 2021-01-17: 1000 mL

## 2021-01-17 MED ORDER — SUGAMMADEX SODIUM 200 MG/2ML IV SOLN
INTRAVENOUS | Status: DC | PRN
  Administered 2021-01-17: 430.8 mg via INTRAVENOUS

## 2021-01-17 MED ORDER — ATORVASTATIN CALCIUM 40 MG PO TABS
40.0000 mg | ORAL_TABLET | Freq: Every day | ORAL | Status: DC
  Administered 2021-01-18: 40 mg via ORAL
  Filled 2021-01-17: qty 1

## 2021-01-17 MED ORDER — PANTOPRAZOLE SODIUM 40 MG PO TBEC
40.0000 mg | DELAYED_RELEASE_TABLET | Freq: Every day | ORAL | Status: DC
  Administered 2021-01-17: 40 mg via ORAL
  Filled 2021-01-17: qty 1

## 2021-01-17 MED ORDER — DEXAMETHASONE SODIUM PHOSPHATE 10 MG/ML IJ SOLN
INTRAMUSCULAR | Status: DC | PRN
  Administered 2021-01-17: 8 mg via INTRAVENOUS

## 2021-01-17 MED ORDER — CHLORHEXIDINE GLUCONATE 0.12 % MT SOLN
15.0000 mL | Freq: Once | OROMUCOSAL | Status: AC
  Administered 2021-01-17: 15 mL via OROMUCOSAL

## 2021-01-17 MED ORDER — THROMBIN 5000 UNITS EX SOLR
CUTANEOUS | Status: AC
  Filled 2021-01-17: qty 5000

## 2021-01-17 MED ORDER — OXYCODONE HCL 5 MG/5ML PO SOLN: 5.0000 mg | Freq: Once | ORAL | Status: AC | PRN

## 2021-01-17 MED ORDER — MAGNESIUM GLUCONATE 500 MG PO TABS
500.0000 mg | ORAL_TABLET | Freq: Two times a day (BID) | ORAL | Status: DC
  Administered 2021-01-17: 500 mg via ORAL
  Filled 2021-01-17 (×2): qty 1

## 2021-01-17 MED ORDER — INSULIN ASPART 100 UNIT/ML IJ SOLN: 0.0000 [IU] | Freq: Every day | INTRAMUSCULAR | Status: DC

## 2021-01-17 MED ORDER — GABAPENTIN 400 MG PO CAPS
800.0000 mg | ORAL_CAPSULE | Freq: Three times a day (TID) | ORAL | Status: DC
  Administered 2021-01-17 – 2021-01-18 (×3): 800 mg via ORAL
  Filled 2021-01-17 (×3): qty 2

## 2021-01-17 MED ORDER — ONDANSETRON HCL 4 MG PO TABS: 4.0000 mg | ORAL_TABLET | Freq: Four times a day (QID) | ORAL | Status: DC | PRN

## 2021-01-17 MED ORDER — BUPIVACAINE HCL (PF) 0.5 % IJ SOLN
INTRAMUSCULAR | Status: AC
  Filled 2021-01-17: qty 30

## 2021-01-17 MED ORDER — DOCUSATE SODIUM 100 MG PO CAPS
100.0000 mg | ORAL_CAPSULE | Freq: Two times a day (BID) | ORAL | Status: DC
  Administered 2021-01-17 – 2021-01-18 (×3): 100 mg via ORAL
  Filled 2021-01-17 (×3): qty 1

## 2021-01-17 SURGICAL SUPPLY — 71 items
ADH SKN CLS APL DERMABOND .7 (GAUZE/BANDAGES/DRESSINGS) ×1
APL SKNCLS STERI-STRIP NONHPOA (GAUZE/BANDAGES/DRESSINGS)
BAG COUNTER SPONGE SURGICOUNT (BAG) ×2 IMPLANT
BAG SPNG CNTER NS LX DISP (BAG) ×1
BASKET BONE COLLECTION (BASKET) ×2 IMPLANT
BENZOIN TINCTURE PRP APPL 2/3 (GAUZE/BANDAGES/DRESSINGS) IMPLANT
BLADE CLIPPER SURG (BLADE) ×1 IMPLANT
BLADE SURG 11 STRL SS (BLADE) ×2 IMPLANT
BUR MATCHSTICK NEURO 3.0 LAGG (BURR) ×2 IMPLANT
BUR PRECISION FLUTE 5.0 (BURR) ×2 IMPLANT
CANISTER SUCT 3000ML PPV (MISCELLANEOUS) ×2 IMPLANT
CARTRIDGE OIL MAESTRO DRILL (MISCELLANEOUS) ×1 IMPLANT
CNTNR URN SCR LID CUP LEK RST (MISCELLANEOUS) ×1 IMPLANT
CONT SPEC 4OZ STRL OR WHT (MISCELLANEOUS) ×2
COVER BACK TABLE 60X90IN (DRAPES) ×2 IMPLANT
DECANTER SPIKE VIAL GLASS SM (MISCELLANEOUS) ×2 IMPLANT
DERMABOND ADVANCED (GAUZE/BANDAGES/DRESSINGS) ×1
DERMABOND ADVANCED .7 DNX12 (GAUZE/BANDAGES/DRESSINGS) ×1 IMPLANT
DIFFUSER DRILL AIR PNEUMATIC (MISCELLANEOUS) ×2 IMPLANT
DRAPE C-ARM 42X72 X-RAY (DRAPES) ×2 IMPLANT
DRAPE C-ARMOR (DRAPES) ×2 IMPLANT
DRAPE LAPAROTOMY 100X72X124 (DRAPES) ×2 IMPLANT
DRAPE SURG 17X23 STRL (DRAPES) ×2 IMPLANT
DRSG OPSITE POSTOP 4X6 (GAUZE/BANDAGES/DRESSINGS) ×1 IMPLANT
DURAPREP 26ML APPLICATOR (WOUND CARE) ×2 IMPLANT
ELECT REM PT RETURN 9FT ADLT (ELECTROSURGICAL) ×2
ELECTRODE REM PT RTRN 9FT ADLT (ELECTROSURGICAL) ×1 IMPLANT
GAUZE 4X4 16PLY ~~LOC~~+RFID DBL (SPONGE) ×1 IMPLANT
GAUZE SPONGE 4X4 12PLY STRL (GAUZE/BANDAGES/DRESSINGS) IMPLANT
GLOVE EXAM NITRILE XL STR (GLOVE) IMPLANT
GLOVE SURG ENC MOIS LTX SZ7.5 (GLOVE) ×2 IMPLANT
GLOVE SURG LTX SZ7 (GLOVE) ×4 IMPLANT
GLOVE SURG UNDER POLY LF SZ7.5 (GLOVE) ×4 IMPLANT
GOWN STRL REUS W/ TWL LRG LVL3 (GOWN DISPOSABLE) ×4 IMPLANT
GOWN STRL REUS W/ TWL XL LVL3 (GOWN DISPOSABLE) IMPLANT
GOWN STRL REUS W/TWL 2XL LVL3 (GOWN DISPOSABLE) IMPLANT
GOWN STRL REUS W/TWL LRG LVL3 (GOWN DISPOSABLE) ×10
GOWN STRL REUS W/TWL XL LVL3 (GOWN DISPOSABLE)
GRAFT BONE PROTEIOS XS 0.5CC (Orthopedic Implant) ×1 IMPLANT
HEMOSTAT POWDER KIT SURGIFOAM (HEMOSTASIS) ×2 IMPLANT
KIT BASIN OR (CUSTOM PROCEDURE TRAY) ×2 IMPLANT
KIT POSITION SURG JACKSON T1 (MISCELLANEOUS) ×2 IMPLANT
KIT TURNOVER KIT B (KITS) ×2 IMPLANT
MILL MEDIUM DISP (BLADE) ×2 IMPLANT
NDL HYPO 18GX1.5 BLUNT FILL (NEEDLE) IMPLANT
NDL SPNL 18GX3.5 QUINCKE PK (NEEDLE) IMPLANT
NEEDLE HYPO 18GX1.5 BLUNT FILL (NEEDLE) IMPLANT
NEEDLE HYPO 22GX1.5 SAFETY (NEEDLE) ×2 IMPLANT
NEEDLE SPNL 18GX3.5 QUINCKE PK (NEEDLE) ×2 IMPLANT
NS IRRIG 1000ML POUR BTL (IV SOLUTION) ×2 IMPLANT
OIL CARTRIDGE MAESTRO DRILL (MISCELLANEOUS) ×2
PACK LAMINECTOMY NEURO (CUSTOM PROCEDURE TRAY) ×2 IMPLANT
PAD ARMBOARD 7.5X6 YLW CONV (MISCELLANEOUS) ×6 IMPLANT
PUTTY GRAFTON DBF 6CC W/DELIVE (Putty) ×1 IMPLANT
ROD CC 30MM (Rod) ×2 IMPLANT
SCREW SET SOLERA (Screw) ×8 IMPLANT
SCREW SET SOLERA TI (Screw) IMPLANT
SCREW SOLERA 40X6.5XMA NS SPNE (Screw) IMPLANT
SCREW SOLERA 6.5X40MM (Screw) ×8 IMPLANT
SPACER TLIF CATALYFT 9X40 (Spacer) ×1 IMPLANT
SPONGE SURGIFOAM ABS GEL 100 (HEMOSTASIS) IMPLANT
SPONGE T-LAP 4X18 ~~LOC~~+RFID (SPONGE) ×1 IMPLANT
STRIP CLOSURE SKIN 1/2X4 (GAUZE/BANDAGES/DRESSINGS) IMPLANT
SUT VIC AB 0 CT1 18XCR BRD8 (SUTURE) ×1 IMPLANT
SUT VIC AB 0 CT1 8-18 (SUTURE) ×4
SUT VICRYL 3-0 RB1 18 ABS (SUTURE) ×3 IMPLANT
SYR 3ML LL SCALE MARK (SYRINGE) ×6 IMPLANT
TOWEL GREEN STERILE (TOWEL DISPOSABLE) ×2 IMPLANT
TOWEL GREEN STERILE FF (TOWEL DISPOSABLE) ×2 IMPLANT
TRAY FOLEY MTR SLVR 16FR STAT (SET/KITS/TRAYS/PACK) ×2 IMPLANT
WATER STERILE IRR 1000ML POUR (IV SOLUTION) ×2 IMPLANT

## 2021-01-17 NOTE — Transfer of Care (Signed)
Immediate Anesthesia Transfer of Care Note  Patient: Joshua Perez  Procedure(s) Performed: POSTERIOR LUMBAR INTERBODY FUSION LUMBAR FOUR-FIVE (Spine Lumbar)  Patient Location: PACU  Anesthesia Type:General  Level of Consciousness: awake, alert  and oriented  Airway & Oxygen Therapy: Patient Spontanous Breathing  Post-op Assessment: Report given to RN and Post -op Vital signs reviewed and stable  Post vital signs: Reviewed and stable  Last Vitals:  Vitals Value Taken Time  BP 131/65 01/17/21 1110  Temp 37 C 01/17/21 1110  Pulse 79 01/17/21 1114  Resp 14 01/17/21 1114  SpO2 96 % 01/17/21 1114  Vitals shown include unvalidated device data.  Last Pain:  Vitals:   01/17/21 1110  TempSrc:   PainSc: Asleep      Patients Stated Pain Goal: 4 (13/64/38 3779)  Complications: No notable events documented.

## 2021-01-17 NOTE — Anesthesia Postprocedure Evaluation (Signed)
Anesthesia Post Note  Patient: [name]  Procedure(s) Performed: POSTERIOR LUMBAR INTERBODY FUSION LUMBAR FOUR-FIVE (Spine Lumbar)     Patient location during evaluation: PACU Anesthesia Type: General Level of consciousness: awake and alert, patient cooperative and oriented Pain management: pain level controlled Vital Signs Assessment: post-procedure vital signs reviewed and stable Respiratory status: spontaneous breathing, nonlabored ventilation and respiratory function stable Cardiovascular status: blood pressure returned to baseline and stable Postop Assessment: no apparent nausea or vomiting Anesthetic complications: no   No notable events documented.  Last Vitals:  Vitals:   01/17/21 1210 01/17/21 1246  BP: 130/68 133/76  Pulse: 99 (!) 101  Resp: 17 20  Temp:    SpO2: 91% 97%    Last Pain:  Vitals:   01/17/21 1155  TempSrc:   PainSc: 0-No pain                 Kalle Bernath,E. Josedejesus Marcum

## 2021-01-17 NOTE — H&P (Signed)
Chief Complaint  Back and leg pain  History of Present Illness  Joshua Perez is a 70 y.o. male initially seen in the outpatient neurosurgery clinic referred for evaluation of low back pain and bilateral leg pain.  He describes relatively constant pain throughout the day across his lower back with radiation of pain into the left greater than right leg.  Leg pain is exacerbated whenever he stands and tries to walk.  He says he cannot make it more than about 100 feet.  He has attempted multiple different conservative treatments including physical therapy and injection therapy without lasting improvement.  He therefore elected to proceed with surgical decompression.  Past Medical History   Past Medical History:  Diagnosis Date   Abnormality of gait 09/15/2015   Anxiety    Arthritis    Asthma    Bipolar disorder ([place]) 1965   Onset in childhood; remote history of ECT   Chronic low back pain 12/10/2019   Coronary artery disease    Depression    Diabetes mellitus, type 2 ([place]) 1980   Recent hemoglobin A1c reportedly 5.3; controlled with single oral agent   Diabetic peripheral neuropathy ([place]) 09/15/2015   Erectile dysfunction    Headache    Heart murmur    as a child   High cholesterol    History of kidney stones    Hypertension 1995   Hypothyroidism    Imbalance    Joint pain    Palpitations    Echocardiogram normal in 07/2007 except for mild LVH; Event recorder-first degree AV block, no symptomatic spells, no significant arrhythmias; stress nuclear negative in 1996   PONV (postoperative nausea and vomiting)    Sleep apnea    Syncope and collapse 09/15/2015    Past Surgical History   Past Surgical History:  Procedure Laterality Date   BIOPSY  09/21/2011   NOT DONE. Unable to remove from [facility] list   COLONOSCOPY  09/21/2011   RMR: Colonic polyps-treated/removed as described above/ Colonic diverticulosis. Lax sphincter time. Status post segmental biopsies(TUBULOVILLOUS  ADENOMA/TUBULAR ADENOMA). Prep marginal. TCS 3 months recommended   COLONOSCOPY  01/18/2012   RMR: tubular adenoma. Surveillance due Dec 2016   COLONOSCOPY W/ POLYPECTOMY  2002   Dr. Norberto Sorenson Stark-->25mm sessile trv colon polyp, path unavailable   EYE SURGERY     bilateral cataracts   FRACTURE SURGERY  1967   right ankle repair   HERNIA REPAIR  1956   , right inguinal   [place]   ?  Subsequent reversal   VASECTOMY REVERSAL      Social History   Social History   Tobacco Use   Smoking status: Never   Smokeless tobacco: Never  Vaping Use   Vaping Use: Never used  Substance Use Topics   Alcohol use: No   Drug use: No    Medications   Prior to Admission medications   Medication Sig Start Date End Date Taking? Authorizing Provider  atorvastatin (LIPITOR) 40 MG tablet Take 40 mg by mouth daily. 06/12/19  Yes [provider]  DULoxetine (CYMBALTA) 60 MG capsule Take 60 mg by mouth in the morning and at bedtime. 05/15/19  Yes [provider]  gabapentin (NEURONTIN) 800 MG tablet Take 1 tablet (800 mg total) by mouth 3 (three) times daily. 11/07/20  Yes Kathrynn Ducking, MD  glipiZIDE (GLUCOTROL) 5 MG tablet Take 5 mg by mouth 2 (two) times  daily before a meal. 03/30/19  Yes [provider]  levothyroxine (SYNTHROID, LEVOTHROID) 75 MCG tablet Take 1 tablet (75 mcg total) by mouth daily. 04/22/13  Yes Mikey Kirschner, MD  Magnesium 400 MG TABS Take 800 mg by mouth 2 (two) times daily. 08/13/15  Yes [provider]  metFORMIN (GLUCOPHAGE) 1000 MG tablet Take 1,000 mg by mouth 2 (two) times daily with a meal.   Yes [provider]  tiZANidine (ZANAFLEX) 4 MG tablet Take 1 tablet (4 mg total) by mouth in the morning, at noon, in the evening, and at bedtime. 04/25/20  Yes Kathrynn Ducking, MD  zolpidem (AMBIEN) 10 MG tablet TAKE 1 TABLET BY MOUTH AT BEDTIME AS NEEDED FOR SLEEP 09/28/20  Yes  Kathrynn Ducking, MD  ACCU-CHEK AVIVA PLUS test strip  04/11/19   [provider]  Accu-Chek FastClix Lancets [place]  05/15/19   [provider]  EPINEPHrine (EPIPEN 2-PAK) 0.3 mg/0.3 mL SOAJ injection Inject 0.3 mLs (0.3 mg total) into the muscle once. Patient not taking: Reported on 01/10/2021 11/03/12   Mikey Kirschner, MD    Allergies   Allergies  Allergen Reactions   Bee Venom Anaphylaxis   Penicillins Shortness Of Breath, Swelling and Rash    Review of Systems  ROS  Neurologic Exam  Awake, alert, oriented Memory and concentration grossly intact Speech fluent, appropriate CN grossly intact Motor exam: Upper Extremities Deltoid Bicep Tricep Grip  Right 5/5 5/5 5/5 5/5  Left 5/5 5/5 5/5 5/5   Lower Extremities IP Quad PF DF EHL  Right 5/5 5/5 5/5 5/5 5/5  Left 5/5 5/5 5/5 5/5 5/5   Sensation grossly intact to LT  Imaging  MRI of the lumbar spine dated 12/25/2019 was also personally reviewed.  This demonstrates maintenance of lumbar lordosis.  Primary finding is at L4-5 where there is broad-based disc bulge with ligamentous infolding and significant bilateral facet arthropathy.  This leads to moderate to severe central and severe bilateral lateral recess stenosis.  Impression  - 70 y.o. male with low back and bilateral leg pain consistent with neurogenic claudication related to multifactorial stenosis at L4-5.  Patient has attempted multiple different conservative treatments without lasting improvement and therefore presents for surgical decompression and fusion.  Plan  -We will plan on proceeding with L4-5 decompression and fusion  I have reviewed the imaging findings with the patient at length in the office.  We have discussed treatment options.  I did review with him the details of the operation including the expected postoperative course and recovery.  We have also discussed the associated risks, benefits, and alternatives to surgery.  All his questions  today were answered and he provided informed consent to proceed.  Consuella Lose, MD [facility]

## 2021-01-17 NOTE — Anesthesia Procedure Notes (Signed)
Procedure Name: Intubation Date/Time: 01/17/2021 8:00 AM Performed by: Minerva Ends, CRNA Pre-anesthesia Checklist: Patient identified, Emergency Drugs available, Suction available and Patient being monitored Patient Re-evaluated:Patient Re-evaluated prior to induction Oxygen Delivery Method: Circle system utilized Preoxygenation: Pre-oxygenation with 100% oxygen Induction Type: IV induction Ventilation: Mask ventilation with difficulty Laryngoscope Size: Mac and 3 Grade View: Grade I Tube type: Oral Tube size: 7.0 mm Number of attempts: 1 Airway Equipment and Method: Stylet and Oral airway Placement Confirmation: ETT inserted through vocal cords under direct vision, positive ETCO2 and breath sounds checked- equal and bilateral Secured at: 24 cm Tube secured with: Tape Dental Injury: Teeth and Oropharynx as per pre-operative assessment

## 2021-01-17 NOTE — Op Note (Signed)
NEUROSURGERY OPERATIVE NOTE   PREOP DIAGNOSIS:  1. Lumbar spondylosis, L4-5 2. Lumbar spinal stenosis with neurogenic claudication, L4-5  POSTOP DIAGNOSIS: Same  PROCEDURE: 1. L4-L5 laminectomy with complete facetectomy for decompression of exiting nerve roots, more than would be required for placement of interbody graft 2. Placement of anterior interbody device - [employer] expandable cage, 22mm x 77mm 3. Posterior non-segmental instrumentation using cortical pedicle screws at L4 - L5 4. Interbody arthrodesis, L4-5 5. Use of locally harvested bone autograft 6. Use of non-structural bone allograft - dbm, proteiOs  SURGEON: Dr. Consuella Lose, MD  ASSISTANT: Dr. Emelda Brothers, MD  ANESTHESIA: General Endotracheal  EBL: 200cc  SPECIMENS: None  DRAINS: None  COMPLICATIONS: None immediate  CONDITION: Hemodynamically stable to PACU  HISTORY: Joshua Perez is a 70 y.o. male who has been followed in the outpatient clinic with back and leg pain related to spondylosis at L4-5 with associated multifactorial stenosis. Multiple conservative treatments were attempted without significant improvement and we ultimately elected to proceed with surgical decompression and fusion. Risks, benefits, and alternative treatments were reviewed in detail in the office. After all questions were answered, informed consent was obtained and witnessed.  PROCEDURE IN DETAIL: The patient was brought to the operating room via stretcher. After induction of general anesthesia, the patient was positioned on the operative table in the prone position. All pressure points were meticulously padded. Incision was then marked out and prepped and draped in the usual sterile fashion.  After timeout was conducted, skin was infiltrated with local anesthetic.  Spinal needle was introduced and lateral fluoroscopy was used to identify the surface projection of the L4-5 interspace.  Skin incision was then made sharply and  Bovie electrocautery was used to dissect the subcutaneous tissue until the lumbodorsal fascia was identified and incised. The muscle was then elevated in the subperiosteal plane and the L4 lamina and L4-5 facet complexes were identified. Self-retaining retractors were then placed. Lateral fluoroscopy was taken with a dissector in the L4-5 interspace to confirm our location.  At this point attention was turned to decompression. Complete L4 laminectomy was completed with a high-speed drill and Kerrison punches.  Normal dura was identified.  A ball-tipped dissector was then used to identify the foramina bilaterally.  High-speed drill was used to cut across the pars interarticularis and the inferior articulating process of L4 was removed bilaterally.  The exiting nerve roots and the traversing nerve roots were then identified.  The medial and superior portion of the superior articulating process of L5 was removed using combination of Kerrison punches and the high-speed drill.  The tip of the SAP was removed in order to fully decompress the foramen.  Once this was done I was able to identify the foraminal portion of the exiting L4 nerve roots.  Kerrison punch was used to unroofed the proximal portion of the foramen and the L5 nerve root was therefore decompressed.  Once this was done I was able to easily pass a ball-tipped dissector within the L4-5 foramen and underneath the L5 nerve root indicating good decompression.  Disc space was then identified initially on the patient's left.  The disc space was incised and using a combination of shavers and curettes and rongeurs, near complete discectomy was completed from the left side.  The endplates were prepared with curettes.  I then turned my attention to the right side.  I initially attempted to incise the disc space but felt that there were likely osteophytes covering the disc space.  I therefore tapped a 6 mm shaver into what I believed to be the disc space.   Unfortunately, it appeared that the shaver was inserted into the L5 vertebral body just inferior to the L4-5 endplate.  I therefore elected not to proceed with further discectomy on the patient's right side.  The defect in the L5 vertebral body was filled with bone autograft harvested during the decompression.   More autograft harvested during the decompression was mixed with the allograft and ProteiOs and packed into the disc space from the patient's left.  A 9 mm x 29 mm expandable cage was tapped into place on the left side and expanded under fluoroscopic guidance to achieve good endplate apposition and maintain lumbar lordosis.  Good position was confirmed with fluoroscopy.  At this point, the entry points for bilateral L4 and L5 cortical pedicle screws were identified using standard anatomic landmarks and lateral fluoro. Pilot holes were then drilled and tapped to 5.5 x 40 mm. Screws were then placed in L4 and L5, all measuring 6.5 x 40 mm. Prebent 30 mm lordotic rod was then sized and placed into the pedicle screws. Set screws were placed and final tightened. Final AP and lateral fluoroscopic images confirmed good position.  Hemostasis was secured and confirmed with bipolar cautery and morcellized gelfoam with thrombin. The wound was then irrigated with copious amounts of antibiotic saline, then closed in standard fashion using a combination of interrupted 0 and 3-0 Vicryl stitches in the muscular, fascial, and subcutaneous layers. Skin was then closed using standard Dermabond. Sterile dressing was then applied. The patient was then transferred to the stretcher, extubated, and taken to the postanesthesia care unit in stable hemodynamic condition.  At the end of the case all sponge, needle, cottonoid, and instrument counts were correct.   Consuella Lose, MD [facility]

## 2021-01-17 NOTE — Progress Notes (Signed)
Orthopedic Tech Progress Note Patient Details:  MATEUSZ NEILAN 1950/11/07 [id number]  PACU RN called requesting a LSO BACK BRACE   Ortho Devices Type of Ortho Device: Lumbar corsett Ortho Device/Splint Location: BACK Ortho Device/Splint Interventions: Ordered   Post Interventions Patient Tolerated: Well Instructions Provided: Care of device  Janit Pagan 01/17/2021, 12:19 PM

## 2021-01-18 DIAGNOSIS — E039 Hypothyroidism, unspecified: Secondary | ICD-10-CM | POA: Diagnosis not present

## 2021-01-18 DIAGNOSIS — Z7984 Long term (current) use of oral hypoglycemic drugs: Secondary | ICD-10-CM | POA: Diagnosis not present

## 2021-01-18 DIAGNOSIS — M48062 Spinal stenosis, lumbar region with neurogenic claudication: Secondary | ICD-10-CM | POA: Diagnosis not present

## 2021-01-18 DIAGNOSIS — Z79899 Other long term (current) drug therapy: Secondary | ICD-10-CM | POA: Diagnosis not present

## 2021-01-18 DIAGNOSIS — I10 Essential (primary) hypertension: Secondary | ICD-10-CM | POA: Diagnosis not present

## 2021-01-18 DIAGNOSIS — E119 Type 2 diabetes mellitus without complications: Secondary | ICD-10-CM | POA: Diagnosis not present

## 2021-01-18 DIAGNOSIS — Z8679 Personal history of other diseases of the circulatory system: Secondary | ICD-10-CM | POA: Diagnosis not present

## 2021-01-18 DIAGNOSIS — M4306 Spondylolysis, lumbar region: Secondary | ICD-10-CM | POA: Diagnosis not present

## 2021-01-18 LAB — BASIC METABOLIC PANEL
Anion gap: 12 (ref 5–15)
BUN: 25 mg/dL — ABNORMAL HIGH (ref 8–23)
CO2: 19 mmol/L — ABNORMAL LOW (ref 22–32)
Calcium: 8.8 mg/dL — ABNORMAL LOW (ref 8.9–10.3)
Chloride: 101 mmol/L (ref 98–111)
Creatinine, Ser: 1.44 mg/dL — ABNORMAL HIGH (ref 0.61–1.24)
GFR, Estimated: 52 mL/min — ABNORMAL LOW (ref >60)
Glucose, Bld: 215 mg/dL — ABNORMAL HIGH (ref 70–99)
Potassium: 4.5 mmol/L (ref 3.5–5.1)
Sodium: 132 mmol/L — ABNORMAL LOW (ref 135–145)

## 2021-01-18 LAB — CBC
HCT: 39.6 % (ref 39.0–52.0)
Hemoglobin: 13.2 g/dL (ref 13.0–17.0)
MCH: 30.4 pg (ref 26.0–34.0)
MCHC: 33.3 g/dL (ref 30.0–36.0)
MCV: 91.2 fL (ref 80.0–100.0)
Platelets: 269 10*3/uL (ref 150–400)
RBC: 4.34 MIL/uL (ref 4.22–5.81)
RDW: 13.3 % (ref 11.5–15.5)
WBC: 14.9 10*3/uL — ABNORMAL HIGH (ref 4.0–10.5)
nRBC: 0 % (ref 0.0–0.2)

## 2021-01-18 LAB — GLUCOSE, CAPILLARY: Glucose-Capillary: 218 mg/dL — ABNORMAL HIGH (ref 70–99)

## 2021-01-18 MED ORDER — METHOCARBAMOL 500 MG PO TABS: 500.0000 mg | ORAL_TABLET | Freq: Three times a day (TID) | ORAL | 1 refills | Status: AC | PRN

## 2021-01-18 MED ORDER — OXYCODONE HCL 10 MG PO TABS: 10.0000 mg | ORAL_TABLET | Freq: Four times a day (QID) | ORAL | 0 refills | Status: AC | PRN

## 2021-01-18 NOTE — Progress Notes (Signed)
Patient alert and oriented, mae's well, voiding adequate amount of urine, swallowing without difficulty, no c/o pain at time of discharge. Patient discharged home with family. Script and discharged instructions given to patient. Patient and family stated understanding of instructions given. Patient has an appointment with Dr. Nundkumar in 2 weeks 

## 2021-01-18 NOTE — Evaluation (Signed)
Occupational Therapy Evaluation Patient Details Name: Joshua Perez MRN: [id number] DOB: 1950/08/07 Today's Date: 01/18/2021   History of Present Illness Pt is a 70 y/o male that presents w/ history of low back pain and BLE pain, L>R. Did not achieve lasting pain relief with PT or injection therapy. Elected to undergo L4-5 posterior interbody fusion on 12/6. PMH significant for bipolar disorder, CAD, DM II, diabetic neuropathy, HTN, hypothyroidism.   Clinical Impression   Pt admitted for concerns listed above. PTA pt reported that he was independent with all ADL's and IADL's. At this time, pt is very limited by pain, weakness, and decreased activity tolerance. Pt's wife was in the room and very helpful/supportive. Pt requiring set up to  min A for all ADL's at this time and pt's wife independently assisting with all needs. At this time, pt has no further OT needs and acute OT will sign off.      Recommendations for follow up therapy are one component of a multi-disciplinary discharge planning process, led by the attending physician.  Recommendations may be updated based on patient status, additional functional criteria and insurance authorization.   Follow Up Recommendations  No OT follow up    Assistance Recommended at Discharge Set up Supervision/Assistance  Functional Status Assessment  Patient has had a recent decline in their functional status and demonstrates the ability to make significant improvements in function in a reasonable and predictable amount of time.  Equipment Recommendations  BSC/3in1    Recommendations for Other Services       Precautions / Restrictions Precautions Precautions: Fall;Back Precaution Booklet Issued: Yes (comment) Precaution Comments: Reviewed handout and pt was cued for precautions during functional mobility. Required Braces or Orthoses: Spinal Brace Spinal Brace: Lumbar corset;Applied in sitting position Restrictions Weight Bearing  Restrictions: No      Mobility Bed Mobility Overal bed mobility: Modified Independent             General bed mobility comments: Pt demonstrated proper log roll technique with increased time and use of rails.    Transfers Overall transfer level: Needs assistance Equipment used: Rolling walker (2 wheels) Transfers: Sit to/from Stand Sit to Stand: Min guard           General transfer comment: Hands on guarding for safety as pt powered up to full stand. VC's for hand placement on seated surface for safety intead of hands on RW for support.      Balance Overall balance assessment: Needs assistance Sitting-balance support: Feet supported;No upper extremity supported Sitting balance-Leahy Scale: Fair     Standing balance support: Bilateral upper extremity supported;During functional activity;Reliant on assistive device for balance Standing balance-Leahy Scale: Poor                             ADL either performed or assessed with clinical judgement   ADL Overall ADL's : Needs assistance/impaired                                       General ADL Comments: Pt requiring up to min A for all ADL's, wife providing all assistance with no difficulties. Pt and wife educated on copmensatory strategies for ADL's, pt reporting that he perfers to have his wife assist.     Vision Baseline Vision/History: 1 Wears glasses Ability to See in Adequate Light: 0 Adequate Patient Visual  Report: No change from baseline Vision Assessment?: No apparent visual deficits     Perception     Praxis      Pertinent Vitals/Pain Pain Assessment: 0-10 Pain Score: 10-Worst pain ever Pain Location: Back/incision site Pain Descriptors / Indicators: Operative site guarding;Aching;Sore Pain Intervention(s): Limited activity within patient's tolerance;Premedicated before session;Repositioned;Monitored during session     Hand Dominance Right   Extremity/Trunk  Assessment Upper Extremity Assessment Upper Extremity Assessment: Overall WFL for tasks assessed   Lower Extremity Assessment Lower Extremity Assessment: Defer to PT evaluation LLE Deficits / Details: Decreased strength and muscular endurance consistent with pre-op diagnosis   Cervical / Trunk Assessment Cervical / Trunk Assessment: Back Surgery   Communication Communication Communication: No difficulties   Cognition Arousal/Alertness: Awake/alert Behavior During Therapy: WFL for tasks assessed/performed Overall Cognitive Status: Within Functional Limits for tasks assessed                                       General Comments  VSS on RA, increased pain, honeycomb dressing in tact with minimal dried blood on dressing    Exercises     Shoulder Instructions      Home Living Family/patient expects to be discharged to:: Private residence Living Arrangements: Spouse/significant other Available Help at Discharge: Family;Available 24 hours/day Type of Home: House Home Access: Stairs to enter [employer] of Steps: 4 Entrance Stairs-Rails: None Home Layout: One level               Home Equipment: Conservation officer, nature (2 wheels)          Prior Functioning/Environment Prior Level of Function : Independent/Modified Independent             Mobility Comments: With RW all the time          OT Problem List: Decreased strength;Decreased activity tolerance;Impaired balance (sitting and/or standing);Pain      OT Treatment/Interventions:      OT Goals(Current goals can be found in the care plan section) Acute Rehab OT Goals Patient Stated Goal: to decrease pain OT Goal Formulation: All assessment and education complete, DC therapy Time For Goal Achievement: 01/18/21 Potential to Achieve Goals: Good  OT Frequency:     Barriers to D/C:            Co-evaluation              AM-PAC OT "6 Clicks" Daily Activity     Outcome Measure  Help from another person eating meals?: A Little Help from another person taking care of personal grooming?: A Little Help from another person toileting, which includes using toliet, bedpan, or urinal?: A Little Help from another person bathing (including washing, rinsing, drying)?: A Little Help from another person to put on and taking off regular upper body clothing?: A Little Help from another person to put on and taking off regular lower body clothing?: A Little 6 Click Score: 18   End of Session Equipment Utilized During Treatment: Rolling walker (2 wheels);Back brace Nurse Communication: Mobility status  Activity Tolerance: Patient limited by pain Patient left: in chair;with call bell/phone within reach;with family/visitor present  OT Visit Diagnosis: Unsteadiness on feet (R26.81);Muscle weakness (generalized) (M62.81)                Time: 2297-9892 OT Time Calculation (min): 41 min Charges:  OT General Charges $OT Visit: 1 Visit OT Evaluation $OT Eval Low  Complexity: 1 Low OT Treatments $Self Care/Home Management : 23-37 mins  Jolee Critcher H., OTR/L Acute Rehabilitation  Marleni Gallardo Elane Vimal Derego 01/18/2021, 10:50 AM

## 2021-01-18 NOTE — Discharge Summary (Signed)
Physician Discharge Summary  Patient ID: Joshua Perez MRN: [id number] DOB/AGE: 72-Dec-1952 72 y.o.  Admit date: 01/17/2021 Discharge date: 01/18/2021  Admission Diagnoses:  Lumbar spondylosis  Discharge Diagnoses:  Same Principal Problem:   Spondylolysis of lumbar region   Discharged Condition: Stable  Hospital Course:  Joshua Perez is a 70 y.o. male  admitted after elective L4-5 decompression and fusion.  Patient was at neurologic baseline postoperatively reporting improvement in his left-sided leg pain.  He was ambulating well, tolerating diet, and voiding normally.  Pain was controlled with oral medication.  He therefore requested discharge home.  Treatments: Surgery -   L4-5 decompression and fusion  Discharge Exam: Blood pressure 124/72, pulse 99, temperature 99.6 F (37.6 C), temperature source Oral, resp. rate 16, height 6\' 2"  (1.88 m), weight 107.7 kg, SpO2 95 %. Awake, alert, oriented Speech fluent, appropriate CN grossly intact 5/5 BUE/BLE Wound c/d/i  Disposition: Discharge disposition: 01-Home or Self Care       Discharge Instructions     Call MD for:  redness, tenderness, or signs of infection (pain, swelling, redness, odor or green/yellow discharge around incision site)   Complete by: As directed    Call MD for:  temperature >100.4   Complete by: As directed    Diet - low sodium heart healthy   Complete by: As directed    Discharge instructions   Complete by: As directed    Walk at home as much as possible, at least 4 times / day   Increase activity slowly   Complete by: As directed    Lifting restrictions   Complete by: As directed    No lifting > 10 lbs   May shower / Bathe   Complete by: As directed    48 hours after surgery   May walk up steps   Complete by: As directed    Other Restrictions   Complete by: As directed    No bending/twisting at waist   Remove dressing in 24 hours   Complete by: As directed       Allergies as  of 01/18/2021       Reactions   Bee Venom Anaphylaxis   Penicillins Shortness Of Breath, Swelling, Rash        Medication List     STOP taking these medications    EPINEPHrine 0.3 mg/0.3 mL Soaj injection Commonly known as: EpiPen 2-Pak       TAKE these medications    Accu-Chek Aviva Plus test strip Generic drug: glucose blood   Accu-Chek FastClix Lancets Misc   atorvastatin 40 MG tablet Commonly known as: LIPITOR Take 40 mg by mouth daily.   DULoxetine 60 MG capsule Commonly known as: CYMBALTA Take 60 mg by mouth in the morning and at bedtime.   gabapentin 800 MG tablet Commonly known as: NEURONTIN Take 1 tablet (800 mg total) by mouth 3 (three) times daily.   glipiZIDE 5 MG tablet Commonly known as: GLUCOTROL Take 5 mg by mouth 2 (two) times daily before a meal.   levothyroxine 75 MCG tablet Commonly known as: SYNTHROID Take 1 tablet (75 mcg total) by mouth daily.   Magnesium 400 MG Tabs Take 800 mg by mouth 2 (two) times daily.   metFORMIN 1000 MG tablet Commonly known as: GLUCOPHAGE Take 1,000 mg by mouth 2 (two) times daily with a meal.   methocarbamol 500 MG tablet Commonly known as: ROBAXIN Take 1 tablet (500 mg total) by mouth every 8 (eight) hours as  needed for muscle spasms.   Oxycodone HCl 10 MG Tabs Take 1 tablet (10 mg total) by mouth every 6 (six) hours as needed for up to 7 days for severe pain ((score 7 to 10)).   tiZANidine 4 MG tablet Commonly known as: ZANAFLEX Take 1 tablet (4 mg total) by mouth in the morning, at noon, in the evening, and at bedtime.   zolpidem 10 MG tablet Commonly known as: AMBIEN TAKE 1 TABLET BY MOUTH AT BEDTIME AS NEEDED FOR SLEEP               Durable Medical Equipment  (From admission, onward)           Start     Ordered   01/18/21 1015  For home use only DME 3 n 1  Once        01/18/21 1014            Follow-up Information     Consuella Lose, MD Follow up in 3 week(s).    Specialty: Neurosurgery Contact information: [address] [place] [address] [phone]                 Signed: Jairo Ben 01/18/2021, 4:31 PM

## 2021-01-18 NOTE — Evaluation (Signed)
Physical Therapy Evaluation Patient Details Name: Joshua Perez MRN: [id number] DOB: [birth year] Today's Date: 01/18/2021  History of Present Illness  Pt is a 70 y/o male that presents w/ history of low back pain and BLE pain, L>R. Did not achieve lasting pain relief with PT or injection therapy. Elected to undergo L4-5 posterior interbody fusion on 12/6. PMH significant for bipolar disorder, CAD, DM II, diabetic neuropathy, HTN, hypothyroidism.   Clinical Impression  Pt admitted with above diagnosis. At the time of PT eval, pt was able to demonstrate transfers and ambulation with gross min guard assist to supervision for safety and RW for support. Pt was educated on precautions, brace application/wearing schedule, appropriate activity progression, and car transfer. Pt currently with functional limitations due to the deficits listed below (see PT Problem List). Pt will benefit from skilled PT to increase their independence and safety with mobility to allow discharge to the venue listed below.         Recommendations for follow up therapy are one component of a multi-disciplinary discharge planning process, led by the attending physician.  Recommendations may be updated based on patient status, additional functional criteria and insurance authorization.  Follow Up Recommendations No PT follow up    Assistance Recommended at Discharge PRN  Functional Status Assessment Patient has had a recent decline in their functional status and demonstrates the ability to make significant improvements in function in a reasonable and predictable amount of time.  Equipment Recommendations       Recommendations for Other Services       Precautions / Restrictions Precautions Precautions: Fall;Back Precaution Booklet Issued: Yes (comment) Precaution Comments: Reviewed handout and pt was cued for precautions during functional mobility. Required Braces or Orthoses: Spinal Brace Spinal Brace: Lumbar  corset;Applied in sitting position Restrictions Weight Bearing Restrictions: No      Mobility  Bed Mobility Overal bed mobility: Modified Independent             General bed mobility comments: Sit>supine at end of session. Pt demonstrated proper log roll technique with increased time and use of rails.    Transfers Overall transfer level: Needs assistance Equipment used: Rolling walker (2 wheels) Transfers: Sit to/from Stand Sit to Stand: Min guard           General transfer comment: Hands on guarding for safety as pt powered up to full stand. VC's for hand placement on seated surface for safety intead of hands on RW for support.    Ambulation/Gait Ambulation/Gait assistance: Supervision Gait Distance (Feet): 300 Feet Assistive device: Rolling walker (2 wheels) Gait Pattern/deviations: Step-through pattern;Decreased stride length;Trunk flexed Gait velocity: Decreased Gait velocity interpretation: 1.31 - 2.62 ft/sec, indicative of limited community ambulator   General Gait Details: VC's for improved posture, closer walker proximity, and forward gaze. Mild R lateral lean and noted pt drifting R, running into objects at times.  Stairs Stairs:  (Deferred due to pain)          Wheelchair Mobility    Modified Rankin (Stroke Patients Only)       Balance Overall balance assessment: Needs assistance Sitting-balance support: Feet supported;No upper extremity supported Sitting balance-Leahy Scale: Fair     Standing balance support: Bilateral upper extremity supported;During functional activity;Reliant on assistive device for balance                                 Pertinent Vitals/Pain Pain Assessment: 0-10 Pain  Score: 10-Worst pain ever Pain Location: Back/incision site Pain Descriptors / Indicators: Operative site guarding;Aching;Sore Pain Intervention(s): Limited activity within patient's tolerance;Monitored during session;Repositioned     Home Living Family/patient expects to be discharged to:: Private residence Living Arrangements: Spouse/significant other Available Help at Discharge: Family;Available 24 hours/day Type of Home: House Home Access: Stairs to enter Entrance Stairs-Rails: None Entrance Stairs-Number of Steps: 4   Home Layout: One level Home Equipment: Conservation officer, nature (2 wheels)      Prior Function Prior Level of Function : Independent/Modified Independent             Mobility Comments: With RW all the time       Hand Dominance        Extremity/Trunk Assessment   Upper Extremity Assessment Upper Extremity Assessment: Defer to OT evaluation    Lower Extremity Assessment Lower Extremity Assessment: LLE deficits/detail LLE Deficits / Details: Decreased strength and muscular endurance consistent with pre-op diagnosis    Cervical / Trunk Assessment Cervical / Trunk Assessment: Back Surgery  Communication   Communication: No difficulties  Cognition Arousal/Alertness: Awake/alert Behavior During Therapy: WFL for tasks assessed/performed Overall Cognitive Status: Within Functional Limits for tasks assessed                                          General Comments      Exercises     Assessment/Plan    PT Assessment Patient needs continued PT services  PT Problem List Decreased strength;Decreased activity tolerance;Decreased balance;Decreased mobility;Decreased knowledge of use of DME;Decreased safety awareness;Decreased knowledge of precautions;Pain       PT Treatment Interventions DME instruction;Gait training;Stair training;Functional mobility training;Therapeutic activities;Therapeutic exercise;Neuromuscular re-education;Patient/family education    PT Goals (Current goals can be found in the Care Plan section)  Acute Rehab PT Goals Patient Stated Goal: Decrease pain PT Goal Formulation: With patient Time For Goal Achievement: 01/25/21 Potential to Achieve  Goals: Good    Frequency Min 5X/week   Barriers to discharge        Co-evaluation               AM-PAC PT "6 Clicks" Mobility  Outcome Measure Help needed turning from your back to your side while in a flat bed without using bedrails?: None Help needed moving from lying on your back to sitting on the side of a flat bed without using bedrails?: A Little Help needed moving to and from a bed to a chair (including a wheelchair)?: A Little Help needed standing up from a chair using your arms (e.g., wheelchair or bedside chair)?: A Little Help needed to walk in hospital room?: A Little Help needed climbing 3-5 steps with a railing? : A Little 6 Click Score: 19    End of Session Equipment Utilized During Treatment: Gait belt;Back brace Activity Tolerance: Patient limited by pain Patient left: in bed;with call bell/phone within reach Nurse Communication: Mobility status PT Visit Diagnosis: Unsteadiness on feet (R26.81);Pain Pain - part of body:  (back)    Time: 0902-0926 PT Time Calculation (min) (ACUTE ONLY): 24 min   Charges:   PT Evaluation $PT Eval Low Complexity: 1 Low PT Treatments $Gait Training: 8-22 mins        Rolinda Roan, PT, DPT Acute Rehabilitation Services Pager: [phone] Office: [phone]   Thelma Comp 01/18/2021, 10:36 AM

## 2021-02-01 DIAGNOSIS — M48062 Spinal stenosis, lumbar region with neurogenic claudication: Secondary | ICD-10-CM | POA: Diagnosis not present

## 2021-02-01 DIAGNOSIS — R030 Elevated blood-pressure reading, without diagnosis of hypertension: Secondary | ICD-10-CM | POA: Diagnosis not present

## 2021-02-01 DIAGNOSIS — Z6829 Body mass index (BMI) 29.0-29.9, adult: Secondary | ICD-10-CM | POA: Diagnosis not present

## 2021-02-09 DIAGNOSIS — E1143 Type 2 diabetes mellitus with diabetic autonomic (poly)neuropathy: Secondary | ICD-10-CM | POA: Diagnosis not present

## 2021-02-09 DIAGNOSIS — I10 Essential (primary) hypertension: Secondary | ICD-10-CM | POA: Diagnosis not present

## 2021-02-09 DIAGNOSIS — E7849 Other hyperlipidemia: Secondary | ICD-10-CM | POA: Diagnosis not present

## 2021-02-09 DIAGNOSIS — Z6829 Body mass index (BMI) 29.0-29.9, adult: Secondary | ICD-10-CM | POA: Diagnosis not present

## 2021-04-04 DIAGNOSIS — G4486 Cervicogenic headache: Secondary | ICD-10-CM | POA: Diagnosis not present

## 2021-04-04 DIAGNOSIS — M47812 Spondylosis without myelopathy or radiculopathy, cervical region: Secondary | ICD-10-CM | POA: Diagnosis not present

## 2021-04-13 ENCOUNTER — Ambulatory Visit: Payer: Medicare HMO | Admitting: Neurology

## 2021-04-13 ENCOUNTER — Telehealth: Payer: Self-pay | Admitting: Neurology

## 2021-04-13 ENCOUNTER — Other Ambulatory Visit: Payer: Self-pay

## 2021-04-13 ENCOUNTER — Encounter: Payer: Self-pay | Admitting: Neurology

## 2021-04-13 VITALS — BP 126/78 | HR 82 | Ht 74.0 in | Wt 238.6 lb

## 2021-04-13 DIAGNOSIS — F319 Bipolar disorder, unspecified: Secondary | ICD-10-CM | POA: Diagnosis not present

## 2021-04-13 DIAGNOSIS — M5416 Radiculopathy, lumbar region: Secondary | ICD-10-CM

## 2021-04-13 DIAGNOSIS — M542 Cervicalgia: Secondary | ICD-10-CM

## 2021-04-13 DIAGNOSIS — M5442 Lumbago with sciatica, left side: Secondary | ICD-10-CM | POA: Diagnosis not present

## 2021-04-13 DIAGNOSIS — R262 Difficulty in walking, not elsewhere classified: Secondary | ICD-10-CM | POA: Diagnosis not present

## 2021-04-13 DIAGNOSIS — M436 Torticollis: Secondary | ICD-10-CM | POA: Diagnosis not present

## 2021-04-13 DIAGNOSIS — M5441 Lumbago with sciatica, right side: Secondary | ICD-10-CM

## 2021-04-13 DIAGNOSIS — G8929 Other chronic pain: Secondary | ICD-10-CM | POA: Diagnosis not present

## 2021-04-13 MED ORDER — ZOLPIDEM TARTRATE 10 MG PO TABS: 10.0000 mg | ORAL_TABLET | Freq: Every evening | ORAL | 4 refills | Status: AC | PRN

## 2021-04-13 MED ORDER — TIZANIDINE HCL 4 MG PO TABS: 4.0000 mg | ORAL_TABLET | Freq: Four times a day (QID) | ORAL | 4 refills | Status: AC

## 2021-04-13 NOTE — Progress Notes (Signed)
Reason for visit: Chronic neck and low back pain, lumbar spinal stenosis, diabetic peripheral neuropathy  COREY LASKI is an 71 y.o. male here to follow up with me after my colleague Dr. Jannifer Franklin has retired.  This is a 71 year old patient with a history of diabetes, diabetic peripheral neuropathy, chronic low back pain status post recent low back surgery for his severe spinal stenosis at the L4-L5 level, also chronic neck pain with severe chronic muscle spasms and cervicogenic headaches, also neuro foraminal stenosis at the C5-C6 levels on both sides, has not responded to multiple medical therapies, he has had physical therapy, epidural steroid injections have not offered any significant duration of pain relief, he has tried multiple medications including gabapentin, prednisone, tizanidine, steroids, Cymbalta.  EMG nerve conduction study in the past showed axonal peripheral neuropathy likely due to his diabetes.  Due to his gait abnormality he underwent a DaTscan which was negative and today I see no suggestion of parkinsonian symptoms.  Thought to have a multifactorial syndrome due to his chronic spine issues, chronic pain, peripheral neuropathy.  I had a very long talk with patient and wife today, at this point I do not believe that there is anything neurology continue anymore.  We have tried him on multiple medications that have not worked.  Patient states that he is in significant chronic pain he talks about his chronic pain throughout most of the appointment, his wife talks about his chronic pain, I think he needs to be referred to a chronic pain center.  I also discussed his ongoing Ambien use, I do not agree with ongoing Ambien use, its not indicated for long-term use, there are significant sequelae to using it including a possible twofold risk of dementia.  Patient is extremely resistant to stopping his ambient, saying that is the only thing that gives him quality of life.  At this point I have  no more to offer patient and we will try to get him into a pain management center.  Tried gabapentin, prednisone, tizanidine, steroids, cymbalta, PT, ESI  History of present illness: Dr. Jannifer Franklin.  Mr. Martelli is a 71 year old left-handed white male with a history of diabetes associated with a diabetic peripheral neuropathy.  The patient has chronic low back pain with neuro claudication symptoms that go down both legs when walking.  The patient is limited to about 150 feet of walking before he has to rest.  He has moderate to severe spinal stenosis at the L4-5 level.  The patient also has chronic neck pain with severe chronic muscle spasm and cervicogenic headache.  He denies any pain down the arms on either side.  He has some neuroforaminal stenosis at the C5-6 levels on both sides.  He has not responded to medical therapy, he does get physical therapy at times but with no significant benefit.  Epidural steroid injections in the neck and low back have not offered any significant duration of relief of pain.  The patient walks with a walker, he has fallen on occasion, the last fall was about 2 months ago, he fell backwards and struck the back of his head.  He comes in for further evaluation today. EMG/NCS showed axonal peripheral neuropathy.   I called the patient.  The patient has had a chronic gait disorder over the last 5 or 6 years, it is gradually getting worse.  He does have a diabetic peripheral neuropathy but also has lumbosacral spinal stenosis at the L4-5 level.  He has been  evaluated in the past for possible Parkinson's disease, a DaTscan was negative.  He had physical therapy for his walking in the fall 2021, he says it did not help him.  He underwent an epidural injection of the back, he claims that this did not help his back pain, he continues to have pain.  He believes that the epidural made his pain worse.  I have recommended a neurosurgical referral, he wants to think about this and let me  know what he wants to do.  He is using a walker mainly outside of the house, he indicates that when he bends over to pick up something, he will tend to go forward and fall.  He likely has a multifactorial gait disorder, associated with the peripheral neuropathy and possibly related in part to the spinal stenosis as well.  Patient complains of symptoms per HPI as well as the following symptoms: chronic pain . Pertinent negatives and positives per HPI. All others negative   Past Medical History:  Diagnosis Date   Abnormality of gait 09/15/2015   Anxiety    Arthritis    Asthma    Bipolar disorder ([place]) 1965   Onset in childhood; remote history of ECT   Chronic low back pain 12/10/2019   Coronary artery disease    Depression    Diabetes mellitus, type 2 ([place]) 1980   Recent hemoglobin A1c reportedly 5.3; controlled with single oral agent   Diabetic peripheral neuropathy ([place]) 09/15/2015   Erectile dysfunction    Headache    Heart murmur    as a child   High cholesterol    History of kidney stones    Hypertension 1995   Hypothyroidism    Imbalance    Joint pain    Palpitations    Echocardiogram normal in 07/2007 except for mild LVH; Event recorder-first degree AV block, no symptomatic spells, no significant arrhythmias; stress nuclear negative in 1996   PONV (postoperative nausea and vomiting)    Sleep apnea    Syncope and collapse 09/15/2015    Past Surgical History:  Procedure Laterality Date   BIOPSY  09/21/2011   NOT DONE. Unable to remove from [facility] list   COLONOSCOPY  09/21/2011   RMR: Colonic polyps-treated/removed as described above/ Colonic diverticulosis. Lax sphincter time. Status post segmental biopsies(TUBULOVILLOUS ADENOMA/TUBULAR ADENOMA). Prep marginal. TCS 3 months recommended   COLONOSCOPY  01/18/2012   RMR: tubular adenoma. Surveillance due Dec 2016   COLONOSCOPY W/ POLYPECTOMY  2002   Dr. Norberto Sorenson [place]-->46mm sessile trv colon polyp, path unavailable   EYE  SURGERY     bilateral cataracts   FRACTURE SURGERY  1967   right ankle repair   HERNIA REPAIR  1956   , right inguinal   [place]   ?  Subsequent reversal   VASECTOMY REVERSAL      Family History  Problem Relation Age of Onset   Diabetes Father    Heart attack Father    Alzheimer's disease Mother    Diabetes Paternal Grandfather    Heart disease Paternal Grandfather    Colon cancer Neg Hx    Liver disease Neg Hx    Inflammatory bowel disease Neg Hx     Social history:  reports that he has never smoked. He has never used smokeless tobacco. He reports that he does not drink alcohol and does not use drugs.    Allergies  Allergen Reactions  Bee Venom Anaphylaxis   Penicillins Shortness Of Breath, Swelling and Rash   Trazodone     Medications:  Prior to Admission medications   Medication Sig Start Date End Date Taking? Authorizing Provider  ACCU-CHEK AVIVA PLUS test strip  04/11/19  Yes [provider]  Accu-Chek FastClix Lancets [place]  05/15/19  Yes [provider]  atorvastatin (LIPITOR) 40 MG tablet  06/12/19  Yes [provider]  DULoxetine (CYMBALTA) 60 MG capsule Take 60 mg by mouth in the morning and at bedtime. 05/15/19  Yes [provider]  EPINEPHrine (EPIPEN 2-PAK) 0.3 mg/0.3 mL SOAJ injection Inject 0.3 mLs (0.3 mg total) into the muscle once. 11/03/12  Yes Mikey Kirschner, MD  fludrocortisone (FLORINEF) 0.1 MG tablet Take 0.1 mg by mouth daily.   Yes [provider]  gabapentin (NEURONTIN) 800 MG tablet Take 1 tablet (800 mg total) by mouth 3 (three) times daily. 11/07/20  Yes Kathrynn Ducking, MD  glipiZIDE (GLUCOTROL) 5 MG tablet  03/30/19  Yes [provider]  levothyroxine (SYNTHROID, LEVOTHROID) 75 MCG tablet Take 1 tablet (75 mcg total) by mouth daily. 04/22/13  Yes Mikey Kirschner, MD  Magnesium 400 MG TABS Take 2 tablets by mouth 2 (two) times  daily.  08/13/15  Yes [provider]  metFORMIN (GLUCOPHAGE) 1000 MG tablet Take 1,000 mg by mouth 2 (two) times daily with a meal.   Yes [provider]  tiZANidine (ZANAFLEX) 4 MG tablet Take 1 tablet (4 mg total) by mouth in the morning, at noon, in the evening, and at bedtime. 04/25/20  Yes Kathrynn Ducking, MD  zolpidem (AMBIEN) 10 MG tablet TAKE 1 TABLET BY MOUTH AT BEDTIME AS NEEDED FOR SLEEP 09/28/20  Yes Kathrynn Ducking, MD   Exam: NAD, pleasant                  Speech:    Speech is normal; fluent and spontaneous with normal comprehension.  Neck: Severe range of motion limitation.  Pain on range of motion of neck. Cognition:    The patient is oriented to person, place, and time;     recent and remote memory intact;     language fluent;    Cranial Nerves:    The pupils are equal, round, and reactive to light.Trigeminal sensation is intact and the muscles of mastication are normal. The face is symmetric. The palate elevates in the midline. Hearing intact. Voice is normal. Shoulder shrug is normal. The tongue has normal motion without fasciculations.   Coordination:  No dysmetria noted  Motor Observation:    No asymmetry, no atrophy, and no involuntary movements noted. Tone:    Normal muscle tone.     Strength: Appears to have good strength in the bilateral lower extremities however difficult to ascertain due to recent injury of low back after surgery, he has an upcoming appointment with neurosurgery.  Strength is intact in the upper extremities.      Sensation: intact to LT  Gait: Wide-based with a cane, appears uncomfortable with walking, antalgic   Assessment/Plan:Yesenia L Cedar is an 71 y.o. male here to follow up with me after my colleague Dr. Jannifer Franklin has retired.This is a 71 year old patient with a history of diabetes, diabetic peripheral neuropathy, chronic low back pain status post recent low back surgery for his severe spinal stenosis at the L4-L5  level, also chronic neck pain with severe chronic muscle spasms and cervicogenic headaches, also neuro foraminal stenosis at the  C5-C6 levels on both sides,  Chronic pain:  has not responded to multiple medical therapies, he has had physical therapy, epidural steroid injections have not offered any significant duration of pain relief, he has tried multiple medications including gabapentin, prednisone, tizanidine, steroids, Cymbalta.  I had a very long talk with patient and wife today, at this point I do not believe that there is anything neurology continue anymore.  We have tried him on multiple medications that have not worked.  Patient states that he is in significant chronic pain he talks about his chronic pain throughout most of the appointment, his wife talks about his chronic pain, I think he needs to be referred to a chronic pain center.  At this point I have no more to offer patient and we will try to get him into a pain management center.  Gait abnormality: EMG nerve conduction study in the past showed axonal peripheral neuropathy likely due to his diabetes.  Due to his gait abnormality he underwent a DaTscan which was negative and today I see no suggestion of parkinsonian symptoms.  Thought to have a multifactorial syndrome due to his chronic spine issues, chronic pain, peripheral neuropathy.  He has a follow-up with his neurosurgeon in several days to discuss.  - I also discussed his ongoing Ambien use, I do not agree with ongoing Ambien use, its not indicated for long-term use, there are significant sequelae to using it including a possible twofold risk of dementia.  Patient is extremely resistant to stopping his ambient, saying that is the only thing that gives him quality of life.  I will refill it for the next several months but patient will be transferred to pain management clinic hopefully for his pain management and all medications.   - I also spent 15 minutes otp trying to get him a sooner  appointment with Dr. Kathyrn Sheriff for follow up after his back surgery, he has an appointment on Monday (3 days)  Sarina Ill, MD  [facility] [address] [place], [place] [address]  Phone [phone] Fax [phone]  I spent over 70 minutes of face-to-face and non-face-to-face time with patient on the  1. Other chronic pain   2. Chronic neck pain   3. Chronic radicular lumbar pain   4. Chronic bilateral low back pain with bilateral sciatica   5. Difficulty in walking   6. Neck stiffness   7. Cervicalgia    diagnosis.  This included previsit chart review, lab review, study review, order entry, electronic health record documentation, patient education on the different diagnostic and therapeutic options, counseling and coordination of care, risks and benefits of management, compliance, or risk factor reduction

## 2021-04-13 NOTE — Telephone Encounter (Signed)
Pt's wife, Brogan Martis request refill for zolpidem (AMBIEN) 10 MG tablet and tiZANidine (ZANAFLEX) 4 MG tablet at [place] [facility]  ?

## 2021-04-16 DIAGNOSIS — M436 Torticollis: Secondary | ICD-10-CM | POA: Insufficient documentation

## 2021-04-16 DIAGNOSIS — M5416 Radiculopathy, lumbar region: Secondary | ICD-10-CM | POA: Insufficient documentation

## 2021-04-16 DIAGNOSIS — G8929 Other chronic pain: Secondary | ICD-10-CM | POA: Insufficient documentation

## 2021-04-16 DIAGNOSIS — R262 Difficulty in walking, not elsewhere classified: Secondary | ICD-10-CM | POA: Insufficient documentation

## 2021-04-16 DIAGNOSIS — M542 Cervicalgia: Secondary | ICD-10-CM | POA: Insufficient documentation

## 2021-04-17 ENCOUNTER — Telehealth: Payer: Self-pay | Admitting: Neurology

## 2021-04-17 DIAGNOSIS — M48062 Spinal stenosis, lumbar region with neurogenic claudication: Secondary | ICD-10-CM | POA: Diagnosis not present

## 2021-04-17 NOTE — Telephone Encounter (Signed)
Referral sent to [facility] [phone] 

## 2021-04-18 ENCOUNTER — Telehealth: Payer: Self-pay | Admitting: "Endocrinology

## 2021-04-18 NOTE — Telephone Encounter (Signed)
[facility] is not in network with [employer]. Referral sent to [facility] [phone]. ?

## 2021-04-18 NOTE — Telephone Encounter (Deleted)
[facility] is not in network with [employer]. Referral sent to [facility] [phone]. ?

## 2021-04-18 NOTE — Telephone Encounter (Signed)
Pt left voicemail wanting to sch appt. I do not see a referral. I tried contacting patient, no answer and unable to leave voicemail.  ?

## 2021-04-19 NOTE — Telephone Encounter (Signed)
Scheduled at [facility] for 05/03/21 at 2:40 pm  ?

## 2021-04-20 DIAGNOSIS — E119 Type 2 diabetes mellitus without complications: Secondary | ICD-10-CM | POA: Diagnosis not present

## 2021-04-25 ENCOUNTER — Telehealth: Payer: Self-pay | Admitting: Neurology

## 2021-04-25 NOTE — Telephone Encounter (Signed)
Pt would like to thank Dr. Jaynee Eagles for everything she has done up to this point.  ?

## 2021-05-03 DIAGNOSIS — G4486 Cervicogenic headache: Secondary | ICD-10-CM | POA: Diagnosis not present

## 2021-05-03 DIAGNOSIS — R519 Headache, unspecified: Secondary | ICD-10-CM | POA: Diagnosis not present

## 2021-05-06 IMAGING — NM NM DATSCAN
1 series · 6 of 6 positions shown · non-contrast
Comparison: None.

CLINICAL DATA: 68-year-old male with gait disturbance. Arm
stiffness.

EXAM:
NUCLEAR MEDICINE BRAIN IMAGING WITH SPECT  (DaTscan )
TECHNIQUE: SPECT images of the brain were obtained after intravenous injection
of radiopharmaceutical. 4 hour post injection imaging. Appropriate
positioning. 0.8 ml lugols solution administered in a.m
RADIOPHARMACEUTICALS:  4.7 millicuries I 123 Ioflupane

[Series 1: brain spect · 4.14mm/px · 6 of 120 frames shown]
[frame 11/120  full-range]
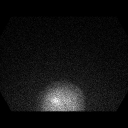
[frame 31/120  full-range]
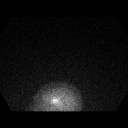
[frame 51/120  full-range]
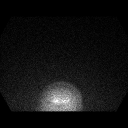
[frame 71/120  full-range]
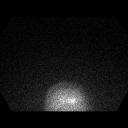
[frame 91/120  full-range]
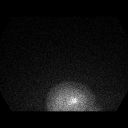
[frame 111/120  full-range]
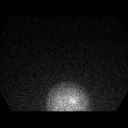

[6 of 6 positions shown; findings below may reference images not displayed]

FINDINGS: The striatum have normal shape with normal activity noted in the
head of the caudate nuclei and posterior striatum. There is
decreased activity in the RIGHT striatum compared to the LEFT which
is favored within normal limits.
IMPRESSION: Normal radiotracer activity in normal-shaped striata. No clear
evidence of loss of dopamine transport populations.

## 2021-05-11 DIAGNOSIS — Z6831 Body mass index (BMI) 31.0-31.9, adult: Secondary | ICD-10-CM | POA: Diagnosis not present

## 2021-05-11 DIAGNOSIS — I10 Essential (primary) hypertension: Secondary | ICD-10-CM | POA: Diagnosis not present

## 2021-05-11 DIAGNOSIS — E7849 Other hyperlipidemia: Secondary | ICD-10-CM | POA: Diagnosis not present

## 2021-05-11 DIAGNOSIS — E1143 Type 2 diabetes mellitus with diabetic autonomic (poly)neuropathy: Secondary | ICD-10-CM | POA: Diagnosis not present

## 2021-05-17 DIAGNOSIS — E119 Type 2 diabetes mellitus without complications: Secondary | ICD-10-CM | POA: Diagnosis not present

## 2021-06-13 DIAGNOSIS — I2510 Atherosclerotic heart disease of native coronary artery without angina pectoris: Secondary | ICD-10-CM | POA: Diagnosis not present

## 2021-06-13 DIAGNOSIS — E785 Hyperlipidemia, unspecified: Secondary | ICD-10-CM | POA: Diagnosis not present

## 2021-06-19 DIAGNOSIS — E119 Type 2 diabetes mellitus without complications: Secondary | ICD-10-CM | POA: Diagnosis not present

## 2021-07-20 DIAGNOSIS — E119 Type 2 diabetes mellitus without complications: Secondary | ICD-10-CM | POA: Diagnosis not present

## 2021-08-07 DIAGNOSIS — E7849 Other hyperlipidemia: Secondary | ICD-10-CM | POA: Diagnosis not present

## 2021-08-07 DIAGNOSIS — I10 Essential (primary) hypertension: Secondary | ICD-10-CM | POA: Diagnosis not present

## 2021-08-07 DIAGNOSIS — E1143 Type 2 diabetes mellitus with diabetic autonomic (poly)neuropathy: Secondary | ICD-10-CM | POA: Diagnosis not present

## 2021-08-10 DIAGNOSIS — E0843 Diabetes mellitus due to underlying condition with diabetic autonomic (poly)neuropathy: Secondary | ICD-10-CM | POA: Diagnosis not present

## 2021-08-10 DIAGNOSIS — Z6830 Body mass index (BMI) 30.0-30.9, adult: Secondary | ICD-10-CM | POA: Diagnosis not present

## 2021-08-10 DIAGNOSIS — F5101 Primary insomnia: Secondary | ICD-10-CM | POA: Diagnosis not present

## 2021-08-10 DIAGNOSIS — M542 Cervicalgia: Secondary | ICD-10-CM | POA: Diagnosis not present

## 2021-08-10 DIAGNOSIS — Z0000 Encounter for general adult medical examination without abnormal findings: Secondary | ICD-10-CM | POA: Diagnosis not present

## 2021-08-10 DIAGNOSIS — E7849 Other hyperlipidemia: Secondary | ICD-10-CM | POA: Diagnosis not present

## 2021-08-10 DIAGNOSIS — I959 Hypotension, unspecified: Secondary | ICD-10-CM | POA: Diagnosis not present

## 2021-08-11 DIAGNOSIS — E1143 Type 2 diabetes mellitus with diabetic autonomic (poly)neuropathy: Secondary | ICD-10-CM | POA: Diagnosis not present

## 2021-08-11 DIAGNOSIS — I10 Essential (primary) hypertension: Secondary | ICD-10-CM | POA: Diagnosis not present

## 2021-08-11 DIAGNOSIS — E7849 Other hyperlipidemia: Secondary | ICD-10-CM | POA: Diagnosis not present

## 2021-08-16 DIAGNOSIS — E1140 Type 2 diabetes mellitus with diabetic neuropathy, unspecified: Secondary | ICD-10-CM | POA: Diagnosis not present

## 2021-08-16 DIAGNOSIS — R5383 Other fatigue: Secondary | ICD-10-CM | POA: Diagnosis not present

## 2021-08-16 DIAGNOSIS — Z794 Long term (current) use of insulin: Secondary | ICD-10-CM | POA: Diagnosis not present

## 2021-08-16 DIAGNOSIS — E1165 Type 2 diabetes mellitus with hyperglycemia: Secondary | ICD-10-CM | POA: Diagnosis not present

## 2021-08-16 DIAGNOSIS — Z7984 Long term (current) use of oral hypoglycemic drugs: Secondary | ICD-10-CM | POA: Diagnosis not present

## 2021-08-28 DIAGNOSIS — E119 Type 2 diabetes mellitus without complications: Secondary | ICD-10-CM | POA: Diagnosis not present

## 2021-09-11 DIAGNOSIS — H5213 Myopia, bilateral: Secondary | ICD-10-CM | POA: Diagnosis not present

## 2021-09-11 DIAGNOSIS — Z794 Long term (current) use of insulin: Secondary | ICD-10-CM | POA: Diagnosis not present

## 2021-09-11 DIAGNOSIS — Z7984 Long term (current) use of oral hypoglycemic drugs: Secondary | ICD-10-CM | POA: Diagnosis not present

## 2021-09-11 DIAGNOSIS — Z7985 Long-term (current) use of injectable non-insulin antidiabetic drugs: Secondary | ICD-10-CM | POA: Diagnosis not present

## 2021-09-11 DIAGNOSIS — H524 Presbyopia: Secondary | ICD-10-CM | POA: Diagnosis not present

## 2021-09-11 DIAGNOSIS — H52203 Unspecified astigmatism, bilateral: Secondary | ICD-10-CM | POA: Diagnosis not present

## 2021-09-11 DIAGNOSIS — E119 Type 2 diabetes mellitus without complications: Secondary | ICD-10-CM | POA: Diagnosis not present

## 2021-09-11 DIAGNOSIS — Z961 Presence of intraocular lens: Secondary | ICD-10-CM | POA: Diagnosis not present

## 2021-09-28 DIAGNOSIS — E119 Type 2 diabetes mellitus without complications: Secondary | ICD-10-CM | POA: Diagnosis not present

## 2021-11-06 DIAGNOSIS — Z789 Other specified health status: Secondary | ICD-10-CM | POA: Diagnosis not present

## 2021-11-06 DIAGNOSIS — Z6830 Body mass index (BMI) 30.0-30.9, adult: Secondary | ICD-10-CM | POA: Diagnosis not present

## 2021-11-06 DIAGNOSIS — Z299 Encounter for prophylactic measures, unspecified: Secondary | ICD-10-CM | POA: Diagnosis not present

## 2021-11-06 DIAGNOSIS — E7800 Pure hypercholesterolemia, unspecified: Secondary | ICD-10-CM | POA: Diagnosis not present

## 2021-11-06 DIAGNOSIS — M4802 Spinal stenosis, cervical region: Secondary | ICD-10-CM | POA: Diagnosis not present

## 2021-11-06 DIAGNOSIS — G4700 Insomnia, unspecified: Secondary | ICD-10-CM | POA: Diagnosis not present

## 2021-11-06 DIAGNOSIS — E119 Type 2 diabetes mellitus without complications: Secondary | ICD-10-CM | POA: Diagnosis not present

## 2021-11-21 DIAGNOSIS — E7800 Pure hypercholesterolemia, unspecified: Secondary | ICD-10-CM | POA: Diagnosis not present

## 2021-11-21 DIAGNOSIS — Z789 Other specified health status: Secondary | ICD-10-CM | POA: Diagnosis not present

## 2021-11-21 DIAGNOSIS — Z23 Encounter for immunization: Secondary | ICD-10-CM | POA: Diagnosis not present

## 2021-11-21 DIAGNOSIS — Z79899 Other long term (current) drug therapy: Secondary | ICD-10-CM | POA: Diagnosis not present

## 2021-11-21 DIAGNOSIS — Z125 Encounter for screening for malignant neoplasm of prostate: Secondary | ICD-10-CM | POA: Diagnosis not present

## 2021-11-21 DIAGNOSIS — E538 Deficiency of other specified B group vitamins: Secondary | ICD-10-CM | POA: Diagnosis not present

## 2021-11-21 DIAGNOSIS — Z6829 Body mass index (BMI) 29.0-29.9, adult: Secondary | ICD-10-CM | POA: Diagnosis not present

## 2021-11-21 DIAGNOSIS — Z1331 Encounter for screening for depression: Secondary | ICD-10-CM | POA: Diagnosis not present

## 2021-11-21 DIAGNOSIS — Z0000 Encounter for general adult medical examination without abnormal findings: Secondary | ICD-10-CM | POA: Diagnosis not present

## 2021-11-21 DIAGNOSIS — Z299 Encounter for prophylactic measures, unspecified: Secondary | ICD-10-CM | POA: Diagnosis not present

## 2021-11-21 DIAGNOSIS — Z7189 Other specified counseling: Secondary | ICD-10-CM | POA: Diagnosis not present

## 2021-11-30 DIAGNOSIS — R5383 Other fatigue: Secondary | ICD-10-CM | POA: Diagnosis not present

## 2021-11-30 DIAGNOSIS — E1142 Type 2 diabetes mellitus with diabetic polyneuropathy: Secondary | ICD-10-CM | POA: Diagnosis not present

## 2021-11-30 DIAGNOSIS — Z299 Encounter for prophylactic measures, unspecified: Secondary | ICD-10-CM | POA: Diagnosis not present

## 2021-12-13 DIAGNOSIS — Z299 Encounter for prophylactic measures, unspecified: Secondary | ICD-10-CM | POA: Diagnosis not present

## 2021-12-13 DIAGNOSIS — H109 Unspecified conjunctivitis: Secondary | ICD-10-CM | POA: Diagnosis not present

## 2021-12-13 DIAGNOSIS — E1165 Type 2 diabetes mellitus with hyperglycemia: Secondary | ICD-10-CM | POA: Diagnosis not present

## 2022-01-09 DIAGNOSIS — M62838 Other muscle spasm: Secondary | ICD-10-CM | POA: Diagnosis not present

## 2022-01-09 DIAGNOSIS — M542 Cervicalgia: Secondary | ICD-10-CM | POA: Diagnosis not present

## 2022-01-09 DIAGNOSIS — G894 Chronic pain syndrome: Secondary | ICD-10-CM | POA: Diagnosis not present

## 2022-01-09 DIAGNOSIS — Z79899 Other long term (current) drug therapy: Secondary | ICD-10-CM | POA: Diagnosis not present

## 2022-01-09 DIAGNOSIS — G629 Polyneuropathy, unspecified: Secondary | ICD-10-CM | POA: Diagnosis not present

## 2022-01-09 DIAGNOSIS — Z79891 Long term (current) use of opiate analgesic: Secondary | ICD-10-CM | POA: Diagnosis not present

## 2022-01-16 DIAGNOSIS — L821 Other seborrheic keratosis: Secondary | ICD-10-CM | POA: Diagnosis not present

## 2022-01-16 DIAGNOSIS — D224 Melanocytic nevi of scalp and neck: Secondary | ICD-10-CM | POA: Diagnosis not present

## 2022-01-16 DIAGNOSIS — D225 Melanocytic nevi of trunk: Secondary | ICD-10-CM | POA: Diagnosis not present

## 2022-01-16 DIAGNOSIS — D485 Neoplasm of uncertain behavior of skin: Secondary | ICD-10-CM | POA: Diagnosis not present

## 2022-01-22 DIAGNOSIS — G4700 Insomnia, unspecified: Secondary | ICD-10-CM | POA: Diagnosis not present

## 2022-01-22 DIAGNOSIS — E7800 Pure hypercholesterolemia, unspecified: Secondary | ICD-10-CM | POA: Diagnosis not present

## 2022-01-22 DIAGNOSIS — Z299 Encounter for prophylactic measures, unspecified: Secondary | ICD-10-CM | POA: Diagnosis not present

## 2022-01-22 DIAGNOSIS — H5789 Other specified disorders of eye and adnexa: Secondary | ICD-10-CM | POA: Diagnosis not present

## 2022-01-22 DIAGNOSIS — E1165 Type 2 diabetes mellitus with hyperglycemia: Secondary | ICD-10-CM | POA: Diagnosis not present

## 2022-01-25 DIAGNOSIS — H10503 Unspecified blepharoconjunctivitis, bilateral: Secondary | ICD-10-CM | POA: Diagnosis not present

## 2022-02-11 DIAGNOSIS — E7800 Pure hypercholesterolemia, unspecified: Secondary | ICD-10-CM | POA: Diagnosis not present

## 2022-02-11 DIAGNOSIS — G4700 Insomnia, unspecified: Secondary | ICD-10-CM | POA: Diagnosis not present

## 2022-02-20 DIAGNOSIS — G629 Polyneuropathy, unspecified: Secondary | ICD-10-CM | POA: Diagnosis not present

## 2022-02-20 DIAGNOSIS — M542 Cervicalgia: Secondary | ICD-10-CM | POA: Diagnosis not present

## 2022-02-20 DIAGNOSIS — G894 Chronic pain syndrome: Secondary | ICD-10-CM | POA: Diagnosis not present

## 2022-02-20 DIAGNOSIS — M62838 Other muscle spasm: Secondary | ICD-10-CM | POA: Diagnosis not present

## 2022-03-09 DIAGNOSIS — Z299 Encounter for prophylactic measures, unspecified: Secondary | ICD-10-CM | POA: Diagnosis not present

## 2022-03-09 DIAGNOSIS — D692 Other nonthrombocytopenic purpura: Secondary | ICD-10-CM | POA: Diagnosis not present

## 2022-03-09 DIAGNOSIS — E1142 Type 2 diabetes mellitus with diabetic polyneuropathy: Secondary | ICD-10-CM | POA: Diagnosis not present

## 2022-03-20 DIAGNOSIS — G894 Chronic pain syndrome: Secondary | ICD-10-CM | POA: Diagnosis not present

## 2022-03-20 DIAGNOSIS — M542 Cervicalgia: Secondary | ICD-10-CM | POA: Diagnosis not present

## 2022-03-20 DIAGNOSIS — G629 Polyneuropathy, unspecified: Secondary | ICD-10-CM | POA: Diagnosis not present

## 2022-03-20 DIAGNOSIS — M62838 Other muscle spasm: Secondary | ICD-10-CM | POA: Diagnosis not present

## 2022-03-29 DIAGNOSIS — M542 Cervicalgia: Secondary | ICD-10-CM | POA: Diagnosis not present

## 2022-03-29 DIAGNOSIS — Z299 Encounter for prophylactic measures, unspecified: Secondary | ICD-10-CM | POA: Diagnosis not present

## 2022-03-29 DIAGNOSIS — E1142 Type 2 diabetes mellitus with diabetic polyneuropathy: Secondary | ICD-10-CM | POA: Diagnosis not present

## 2022-04-30 DIAGNOSIS — Z299 Encounter for prophylactic measures, unspecified: Secondary | ICD-10-CM | POA: Diagnosis not present

## 2022-04-30 DIAGNOSIS — R52 Pain, unspecified: Secondary | ICD-10-CM | POA: Diagnosis not present

## 2022-04-30 DIAGNOSIS — E1142 Type 2 diabetes mellitus with diabetic polyneuropathy: Secondary | ICD-10-CM | POA: Diagnosis not present

## 2022-04-30 DIAGNOSIS — E538 Deficiency of other specified B group vitamins: Secondary | ICD-10-CM | POA: Diagnosis not present

## 2022-04-30 DIAGNOSIS — M4802 Spinal stenosis, cervical region: Secondary | ICD-10-CM | POA: Diagnosis not present

## 2022-05-09 DIAGNOSIS — Z794 Long term (current) use of insulin: Secondary | ICD-10-CM | POA: Diagnosis not present

## 2022-05-09 DIAGNOSIS — E1165 Type 2 diabetes mellitus with hyperglycemia: Secondary | ICD-10-CM | POA: Diagnosis not present

## 2022-06-01 DIAGNOSIS — M4802 Spinal stenosis, cervical region: Secondary | ICD-10-CM | POA: Diagnosis not present

## 2022-06-01 DIAGNOSIS — R52 Pain, unspecified: Secondary | ICD-10-CM | POA: Diagnosis not present

## 2022-06-01 DIAGNOSIS — Z299 Encounter for prophylactic measures, unspecified: Secondary | ICD-10-CM | POA: Diagnosis not present

## 2022-06-01 DIAGNOSIS — G4700 Insomnia, unspecified: Secondary | ICD-10-CM | POA: Diagnosis not present

## 2022-06-01 DIAGNOSIS — M25519 Pain in unspecified shoulder: Secondary | ICD-10-CM | POA: Diagnosis not present

## 2022-06-06 ENCOUNTER — Telehealth: Payer: Self-pay

## 2022-06-06 NOTE — Telephone Encounter (Signed)
-----   Message from Juanda Chance, NP sent at 06/06/2022  8:22 AM EDT ----- OV for neck issues. Moderate spinal canal stenosis.  ----- Message ----- From: Sharlet Salina, CMA Sent: 06/06/2022   8:11 AM EDT To: Tyrell Antonio, MD; Juanda Chance, NP   ----- Message ----- From: Dorcas Mcmurray Sent: 06/04/2022   2:19 PM EDT To: Sharlet Salina, CMA

## 2022-06-06 NOTE — Telephone Encounter (Signed)
Spoke with patient's wife and scheduled OV for 06/07/22.

## 2022-06-07 ENCOUNTER — Encounter: Payer: Self-pay | Admitting: Physical Medicine and Rehabilitation

## 2022-06-07 ENCOUNTER — Ambulatory Visit: Payer: Medicare HMO | Admitting: Physical Medicine and Rehabilitation

## 2022-06-07 DIAGNOSIS — G8929 Other chronic pain: Secondary | ICD-10-CM

## 2022-06-07 DIAGNOSIS — R296 Repeated falls: Secondary | ICD-10-CM

## 2022-06-07 DIAGNOSIS — M25511 Pain in right shoulder: Secondary | ICD-10-CM | POA: Diagnosis not present

## 2022-06-07 DIAGNOSIS — M25512 Pain in left shoulder: Secondary | ICD-10-CM | POA: Diagnosis not present

## 2022-06-07 DIAGNOSIS — M7918 Myalgia, other site: Secondary | ICD-10-CM

## 2022-06-07 DIAGNOSIS — G894 Chronic pain syndrome: Secondary | ICD-10-CM

## 2022-06-07 DIAGNOSIS — M5412 Radiculopathy, cervical region: Secondary | ICD-10-CM

## 2022-06-07 DIAGNOSIS — R269 Unspecified abnormalities of gait and mobility: Secondary | ICD-10-CM | POA: Diagnosis not present

## 2022-06-07 NOTE — Progress Notes (Signed)
Functional Pain Scale - descriptive words and definitions  Distressing (6)    Pain is present/unable to complete most ADLs limited by pain/sleep is difficult and active distraction is only marginal. Moderate range order  Average Pain 9  Neck pain on both sides that radiates into the head, both shoulders and all the way down the back

## 2022-06-07 NOTE — Progress Notes (Signed)
Joshua Perez - 72 y.o. male MRN [id number]  Date of birth: 1950/08/19  Office Visit Note: Visit Date: 06/07/2022 PCP: Toma Deiters, MD Referred by: Toma Deiters, MD  Subjective: Chief Complaint  Patient presents with   Neck - Pain   HPI: Joshua Perez is a 72 y.o. male who comes in today per the request of Sandrea Matte, NP for evaluation of chronic, worsening and severe bilateral neck pain radiating to shoulders, up to temporal region and down both arms. He does experience paraesthesias to both arms. Long standing history of chronic pain, muscles spasms, cervicogenic headache and peripheral neuropathy. Patient is poor historian, wife accompanying him during our visit today. Pain ongoing for several years, worsens with activity and movement. He describes pain as sharp sensation, currently rates as 8 out of 10. He has tried multiple modalities including formal physical therapy, home exercise, rest and use of medications. He is currently prescribed Tramadol by his primary care provider Sandrea Matte, NP. Most recent prescription was 90 tablets of 50 mg Tramadol on 06/01/2022. Cervical MRI imaging from 2022 exhibits moderate spinal canal stenosis at C3-C4 and C5-C6. There is also moderate to severe bilateral foraminal stenosis at C5-C6.  Patient was previously treated by Dr. Lisbeth Renshaw at [facility]. Patient underwent L4-L5 PLIF with Dr. Conchita Paris in 2022.  Patient has been evaluated and treated at multiple pain practices over the years including [facility], [facility] and [facility]. History of multiple cervical epidural steroid injections and cervical medial branch blocks at various practices with no relief of pain. Patient was also treated by [facility]. Eloisa Northern at [facility] in 1997. His wife reports increased pain over the last several years, also reports balance issues and frequent falls.  States they were told he was not candidate for cervical spine surgery by multiple providers. Patient was evaluated by Dr. Francoise Ceo with [facility] in 2023. Per her note, could be a type of multifactorial syndrome due to chronic spine issues, chronic pain and peripheral neuropathy. Dr. Lucia Gaskins recommended chronic pain management at that time.   Overall, patient feels prior treatments were not beneficial in alleviating his pain. Reports chronic pain is negatively impacting his daily life. Patient denies focal weakness. No recent trauma or falls.     Review of Systems  Musculoskeletal:  Positive for falls, myalgias and neck pain.  Neurological:  Positive for tingling and headaches. Negative for focal weakness and weakness.  Psychiatric/Behavioral:  Positive for memory loss.   All other systems reviewed and are negative.  Otherwise per HPI.  Assessment & Plan: Visit Diagnoses:    ICD-10-CM   1. Radiculopathy, cervical region  M54.12     2. Chronic pain of both shoulders  M25.511    G89.29    M25.512     3. Myofascial pain syndrome  M79.18     4. Chronic pain syndrome  G89.4     5. Abnormality of gait  R26.9     6. Frequent falls  R29.6        Plan: Findings:  Chronic, worsening and severe bilateral neck pain radiating to shoulders, up to temporal region and down both arms. Patients continues to have severe pain despite good conservative therapies such as physical therapy, home exercise regimen, rest and use of medications. Patients clinical presentation and exam are complex, there is moderate spinal canal stenosis at the levels of C3-C4 and C5-C6  which could certainly cause radicular symptoms. He is extremely tender to palpation to bilateral neck and upper back today which is more consistent with myofascial pain. I also feel there could be a type of central sensitization syndrome such as fibromyalgia contributing to his pain. We discussed treatment options in  detail today. Patient has failed conservative therapies, no relief with previous interventional spine procedures and chronic pain management. At this point, we would not recommend repeating interventional spine procedures, unsure we have further treatments to offer patient that were not previously performed. I did discuss possible referral to Dr. Charyl Bigger with [facility] to discuss possible treatments. Could also consult with neurosurgery as well. Patient would like to think about options and will get back to [place]. He is instructed to continue with current medication regimen. No red flag symptoms noted upon exam today.     Meds & Orders: No orders of the defined types were placed in this encounter.  No orders of the defined types were placed in this encounter.   Follow-up: Return if symptoms worsen or fail to improve.   Procedures: No procedures performed      Clinical History: EXAM: MRI CERVICAL SPINE WITHOUT CONTRAST  TECHNIQUE: Multiplanar, multisequence MR imaging of the cervical spine was performed. No intravenous contrast was administered.  COMPARISON:  MRI 10/12/2015  FINDINGS: Alignment: Trace retrolisthesis C3 on C4.  Vertebrae: No fracture, evidence of discitis, or bone lesion.  Cord: Normal signal and morphology.  Posterior Fossa, vertebral arteries, paraspinal tissues: Negative.  Disc levels:  C2-C3: No significant disc protrusion, foraminal stenosis, or canal stenosis.  C3-C4: Posterior disc osteophyte complex and mild uncovertebral spurring. Disc material contacts and slightly deforms the ventral cord with moderate canal stenosis. Mild bilateral foraminal stenosis, left greater than right. Findings slightly progressed from prior.  C4-C5: Minimal disc osteophyte complex and right worse than left facet arthropathy. Findings result in mild right foraminal stenosis. No canal stenosis. Findings slightly progressed from prior.  C5-C6: Posterior  disc osteophyte complex contacts and slightly deforms the ventral cord with moderate canal stenosis. Moderate to severe bilateral foraminal stenosis. Findings are similar to slightly progressed from prior.  C6-C7: Small shallow disc protrusion without foraminal or canal stenosis. Unchanged.  C7-T1: Negative. Procedure Note  Plundo, Lenda Kelp, DO - 03/28/2020 Formatting of this note might be different from the original. CLINICAL DATA:  Neck pain with radiculopathy  EXAM: MRI CERVICAL SPINE WITHOUT CONTRAST  TECHNIQUE: Multiplanar, multisequence MR imaging of the cervical spine was performed. No intravenous contrast was administered.  COMPARISON:  MRI 10/12/2015  FINDINGS: Alignment: Trace retrolisthesis C3 on C4.  Vertebrae: No fracture, evidence of discitis, or bone lesion.  Cord: Normal signal and morphology.  Posterior Fossa, vertebral arteries, paraspinal tissues: Negative.  Disc levels:  C2-C3: No significant disc protrusion, foraminal stenosis, or canal stenosis.  C3-C4: Posterior disc osteophyte complex and mild uncovertebral spurring. Disc material contacts and slightly deforms the ventral cord with moderate canal stenosis. Mild bilateral foraminal stenosis, left greater than right. Findings slightly progressed from prior.  C4-C5: Minimal disc osteophyte complex and right worse than left facet arthropathy. Findings result in mild right foraminal stenosis. No canal stenosis. Findings slightly progressed from prior.  C5-C6: Posterior disc osteophyte complex contacts and slightly deforms the ventral cord with moderate canal stenosis. Moderate to severe bilateral foraminal stenosis. Findings are similar to slightly progressed from prior.  C6-C7: Small shallow disc protrusion without foraminal or canal stenosis. Unchanged.  C7-T1: Negative.  IMPRESSION: 1.  Moderate cervical spondylosis slightly progressed from prior CT. 2. Moderate canal stenosis at  C3-4 and C5-6. 3. Moderate-to-severe bilateral foraminal stenosis at C5-6.   Electronically Signed   By: Duanne Guess D.O.   On: 03/28/2020 21:08   He reports that he has never smoked. He has never used smokeless tobacco. No results for input(s): "HGBA1C", "LABURIC" in the last 8760 hours.  Objective:  VS:  HT:    WT:   BMI:     BP:   HR: bpm  TEMP: ( )  RESP:  Physical Exam Vitals and nursing note reviewed.  HENT:     Head: Normocephalic and atraumatic.     Right Ear: External ear normal.     Left Ear: External ear normal.     Nose: Nose normal.     Mouth/Throat:     Mouth: Mucous membranes are moist.  Eyes:     Extraocular Movements: Extraocular movements intact.  Cardiovascular:     Rate and Rhythm: Normal rate.     Pulses: Normal pulses.  Pulmonary:     Effort: Pulmonary effort is normal.  Abdominal:     General: Abdomen is flat. There is no distension.  Musculoskeletal:        General: Tenderness present.     Cervical back: Tenderness present.     Comments: Discomfort noted with extension and side to side rotation. Patient has good strength in the upper extremities including 5 out of 5 strength in wrist extension, long finger flexion and APB. Shoulder range of motion is full bilaterally without any sign of impingement. There is no atrophy of the hands intrinsically. Sensation intact bilaterally. Tenderness noted to bilateral levator scapulae, trapezius and rhomboid regions. Negative Hoffman's sign. Negative Spurling's sign.   Gait is slow and unsteady.     Skin:    General: Skin is warm and dry.     Capillary Refill: Capillary refill takes less than 2 seconds.  Neurological:     Mental Status: He is alert.     Gait: Gait abnormal.  Psychiatric:        Mood and Affect: Mood normal.        Behavior: Behavior normal.     Ortho Exam  Imaging: No results found.  Past Medical/Family/Surgical/Social History: Medications & Allergies reviewed per EMR, new  medications updated. Patient Active Problem List   Diagnosis Date Noted   Other chronic pain 04/16/2021   Chronic neck pain 04/16/2021   Chronic radicular lumbar pain 04/16/2021   Difficulty in walking 04/16/2021   Neck stiffness 04/16/2021   Cervicalgia 04/16/2021   Spondylolysis of lumbar region 01/17/2021   Chronic low back pain 12/10/2019   Syncope and collapse 09/15/2015   Abnormality of gait 09/15/2015   Diabetic peripheral neuropathy 09/15/2015   Essential tremor 04/25/2013   Unspecified hypothyroidism 01/25/2013   Other and unspecified hyperlipidemia 11/03/2012   Transaminitis 06/30/2012   Loose stools 06/30/2012   Abnormal LFTs 04/03/2012   Hypertension    Diabetes mellitus, type 2    Asthmatic bronchitis    Palpitations    Past Medical History:  Diagnosis Date   Abnormality of gait 09/15/2015   Anxiety    Arthritis    Asthma    Bipolar disorder 1965   Onset in childhood; remote history of ECT   Chronic low back pain 12/10/2019   Coronary artery disease    Depression    Diabetes mellitus, type 2 1980   Recent hemoglobin A1c reportedly 5.3; controlled with  single oral agent   Diabetic peripheral neuropathy 09/15/2015   Erectile dysfunction    Headache    Heart murmur    as a child   High cholesterol    History of kidney stones    Hypertension 1995   Hypothyroidism    Imbalance    Joint pain    Palpitations    Echocardiogram normal in 07/2007 except for mild LVH; Event recorder-first degree AV block, no symptomatic spells, no significant arrhythmias; stress nuclear negative in 1996   PONV (postoperative nausea and vomiting)    Sleep apnea    Syncope and collapse 09/15/2015   Family History  Problem Relation Age of Onset   Diabetes Father    Heart attack Father    Alzheimer's disease Mother    Diabetes Paternal Grandfather    Heart disease Paternal Grandfather    Colon cancer Neg Hx    Liver disease Neg Hx    Inflammatory bowel disease Neg Hx     Past Surgical History:  Procedure Laterality Date   BIOPSY  09/21/2011   NOT DONE. Unable to remove from [facility] list   COLONOSCOPY  09/21/2011   RMR: Colonic polyps-treated/removed as described above/ Colonic diverticulosis. Lax sphincter time. Status post segmental biopsies(TUBULOVILLOUS ADENOMA/TUBULAR ADENOMA). Prep marginal. TCS 3 months recommended   COLONOSCOPY  01/18/2012   RMR: tubular adenoma. Surveillance due Dec 2016   COLONOSCOPY W/ POLYPECTOMY  2002   Dr. Judie Petit Stark-->59mm sessile trv colon polyp, path unavailable   EYE SURGERY     bilateral cataracts   FRACTURE SURGERY  1967   right ankle repair   HERNIA REPAIR  1956   , right inguinal   NASAL SEPTUM SURGERY  1974   TONSILLECTOMY  1957   VASECTOMY  1988   ?  Subsequent reversal   VASECTOMY REVERSAL     Social History   Occupational History   Occupation: Disability    Comment: Psychiatric  Tobacco Use   Smoking status: Never   Smokeless tobacco: Never  Vaping Use   Vaping Use: Never used  Substance and Sexual Activity   Alcohol use: No   Drug use: No   Sexual activity: Not on file

## 2022-06-21 DIAGNOSIS — E1142 Type 2 diabetes mellitus with diabetic polyneuropathy: Secondary | ICD-10-CM | POA: Diagnosis not present

## 2022-06-21 DIAGNOSIS — Z299 Encounter for prophylactic measures, unspecified: Secondary | ICD-10-CM | POA: Diagnosis not present

## 2022-06-21 DIAGNOSIS — R52 Pain, unspecified: Secondary | ICD-10-CM | POA: Diagnosis not present

## 2022-06-21 DIAGNOSIS — R3589 Other polyuria: Secondary | ICD-10-CM | POA: Diagnosis not present

## 2022-09-17 DIAGNOSIS — Z794 Long term (current) use of insulin: Secondary | ICD-10-CM | POA: Diagnosis not present

## 2022-09-17 DIAGNOSIS — E1165 Type 2 diabetes mellitus with hyperglycemia: Secondary | ICD-10-CM | POA: Diagnosis not present

## 2022-09-28 DIAGNOSIS — E1142 Type 2 diabetes mellitus with diabetic polyneuropathy: Secondary | ICD-10-CM | POA: Diagnosis not present

## 2022-09-28 DIAGNOSIS — Z299 Encounter for prophylactic measures, unspecified: Secondary | ICD-10-CM | POA: Diagnosis not present

## 2022-09-28 DIAGNOSIS — E1169 Type 2 diabetes mellitus with other specified complication: Secondary | ICD-10-CM | POA: Diagnosis not present

## 2022-09-28 DIAGNOSIS — R52 Pain, unspecified: Secondary | ICD-10-CM | POA: Diagnosis not present

## 2022-09-28 DIAGNOSIS — G4700 Insomnia, unspecified: Secondary | ICD-10-CM | POA: Diagnosis not present

## 2022-09-28 DIAGNOSIS — E7800 Pure hypercholesterolemia, unspecified: Secondary | ICD-10-CM | POA: Diagnosis not present

## 2022-10-01 DIAGNOSIS — Z961 Presence of intraocular lens: Secondary | ICD-10-CM | POA: Diagnosis not present

## 2022-10-01 DIAGNOSIS — H04123 Dry eye syndrome of bilateral lacrimal glands: Secondary | ICD-10-CM | POA: Diagnosis not present

## 2022-11-13 DIAGNOSIS — Z23 Encounter for immunization: Secondary | ICD-10-CM | POA: Diagnosis not present

## 2022-11-13 DIAGNOSIS — R52 Pain, unspecified: Secondary | ICD-10-CM | POA: Diagnosis not present

## 2022-11-13 DIAGNOSIS — M542 Cervicalgia: Secondary | ICD-10-CM | POA: Diagnosis not present

## 2022-11-13 DIAGNOSIS — Z299 Encounter for prophylactic measures, unspecified: Secondary | ICD-10-CM | POA: Diagnosis not present

## 2022-11-13 DIAGNOSIS — E1142 Type 2 diabetes mellitus with diabetic polyneuropathy: Secondary | ICD-10-CM | POA: Diagnosis not present

## 2022-11-13 DIAGNOSIS — D692 Other nonthrombocytopenic purpura: Secondary | ICD-10-CM | POA: Diagnosis not present

## 2022-11-19 DIAGNOSIS — Z1339 Encounter for screening examination for other mental health and behavioral disorders: Secondary | ICD-10-CM | POA: Diagnosis not present

## 2022-11-19 DIAGNOSIS — Z0000 Encounter for general adult medical examination without abnormal findings: Secondary | ICD-10-CM | POA: Diagnosis not present

## 2022-11-19 DIAGNOSIS — Z1331 Encounter for screening for depression: Secondary | ICD-10-CM | POA: Diagnosis not present

## 2022-11-19 DIAGNOSIS — R52 Pain, unspecified: Secondary | ICD-10-CM | POA: Diagnosis not present

## 2022-11-19 DIAGNOSIS — Z299 Encounter for prophylactic measures, unspecified: Secondary | ICD-10-CM | POA: Diagnosis not present

## 2022-11-19 DIAGNOSIS — Z6827 Body mass index (BMI) 27.0-27.9, adult: Secondary | ICD-10-CM | POA: Diagnosis not present

## 2022-11-19 DIAGNOSIS — Z79899 Other long term (current) drug therapy: Secondary | ICD-10-CM | POA: Diagnosis not present

## 2022-11-19 DIAGNOSIS — Z7189 Other specified counseling: Secondary | ICD-10-CM | POA: Diagnosis not present

## 2022-11-30 DIAGNOSIS — E7800 Pure hypercholesterolemia, unspecified: Secondary | ICD-10-CM | POA: Diagnosis not present

## 2022-11-30 DIAGNOSIS — Z125 Encounter for screening for malignant neoplasm of prostate: Secondary | ICD-10-CM | POA: Diagnosis not present

## 2022-11-30 DIAGNOSIS — R5383 Other fatigue: Secondary | ICD-10-CM | POA: Diagnosis not present

## 2022-11-30 DIAGNOSIS — Z79899 Other long term (current) drug therapy: Secondary | ICD-10-CM | POA: Diagnosis not present

## 2022-12-07 DIAGNOSIS — R52 Pain, unspecified: Secondary | ICD-10-CM | POA: Diagnosis not present

## 2022-12-07 DIAGNOSIS — Z6827 Body mass index (BMI) 27.0-27.9, adult: Secondary | ICD-10-CM | POA: Diagnosis not present

## 2022-12-07 DIAGNOSIS — Z299 Encounter for prophylactic measures, unspecified: Secondary | ICD-10-CM | POA: Diagnosis not present

## 2022-12-07 DIAGNOSIS — Z0000 Encounter for general adult medical examination without abnormal findings: Secondary | ICD-10-CM | POA: Diagnosis not present

## 2022-12-21 DIAGNOSIS — Z299 Encounter for prophylactic measures, unspecified: Secondary | ICD-10-CM | POA: Diagnosis not present

## 2022-12-21 DIAGNOSIS — L820 Inflamed seborrheic keratosis: Secondary | ICD-10-CM | POA: Diagnosis not present

## 2022-12-21 DIAGNOSIS — L821 Other seborrheic keratosis: Secondary | ICD-10-CM | POA: Diagnosis not present

## 2022-12-21 DIAGNOSIS — R52 Pain, unspecified: Secondary | ICD-10-CM | POA: Diagnosis not present

## 2022-12-21 DIAGNOSIS — D485 Neoplasm of uncertain behavior of skin: Secondary | ICD-10-CM | POA: Diagnosis not present

## 2022-12-21 DIAGNOSIS — G4700 Insomnia, unspecified: Secondary | ICD-10-CM | POA: Diagnosis not present

## 2023-01-04 DIAGNOSIS — Z299 Encounter for prophylactic measures, unspecified: Secondary | ICD-10-CM | POA: Diagnosis not present

## 2023-01-04 DIAGNOSIS — E1140 Type 2 diabetes mellitus with diabetic neuropathy, unspecified: Secondary | ICD-10-CM | POA: Diagnosis not present

## 2023-01-04 DIAGNOSIS — R5383 Other fatigue: Secondary | ICD-10-CM | POA: Diagnosis not present

## 2023-01-04 DIAGNOSIS — E1165 Type 2 diabetes mellitus with hyperglycemia: Secondary | ICD-10-CM | POA: Diagnosis not present

## 2023-01-23 DIAGNOSIS — Z299 Encounter for prophylactic measures, unspecified: Secondary | ICD-10-CM | POA: Diagnosis not present

## 2023-01-23 DIAGNOSIS — E1165 Type 2 diabetes mellitus with hyperglycemia: Secondary | ICD-10-CM | POA: Diagnosis not present

## 2023-01-23 DIAGNOSIS — J069 Acute upper respiratory infection, unspecified: Secondary | ICD-10-CM | POA: Diagnosis not present

## 2023-02-01 DIAGNOSIS — J209 Acute bronchitis, unspecified: Secondary | ICD-10-CM | POA: Diagnosis not present

## 2023-02-01 DIAGNOSIS — R059 Cough, unspecified: Secondary | ICD-10-CM | POA: Diagnosis not present

## 2023-02-01 DIAGNOSIS — J069 Acute upper respiratory infection, unspecified: Secondary | ICD-10-CM | POA: Diagnosis not present

## 2023-02-01 DIAGNOSIS — R0981 Nasal congestion: Secondary | ICD-10-CM | POA: Diagnosis not present

## 2023-02-01 DIAGNOSIS — R9389 Abnormal findings on diagnostic imaging of other specified body structures: Secondary | ICD-10-CM | POA: Diagnosis not present

## 2023-02-01 DIAGNOSIS — Z299 Encounter for prophylactic measures, unspecified: Secondary | ICD-10-CM | POA: Diagnosis not present

## 2023-02-15 DIAGNOSIS — E1165 Type 2 diabetes mellitus with hyperglycemia: Secondary | ICD-10-CM | POA: Diagnosis not present

## 2023-02-15 DIAGNOSIS — D692 Other nonthrombocytopenic purpura: Secondary | ICD-10-CM | POA: Diagnosis not present

## 2023-02-15 DIAGNOSIS — E1140 Type 2 diabetes mellitus with diabetic neuropathy, unspecified: Secondary | ICD-10-CM | POA: Diagnosis not present

## 2023-02-15 DIAGNOSIS — Z299 Encounter for prophylactic measures, unspecified: Secondary | ICD-10-CM | POA: Diagnosis not present

## 2023-02-26 DIAGNOSIS — M5412 Radiculopathy, cervical region: Secondary | ICD-10-CM | POA: Diagnosis not present

## 2023-02-26 DIAGNOSIS — G4486 Cervicogenic headache: Secondary | ICD-10-CM | POA: Diagnosis not present

## 2023-02-26 DIAGNOSIS — M4802 Spinal stenosis, cervical region: Secondary | ICD-10-CM | POA: Diagnosis not present

## 2023-03-01 DIAGNOSIS — R918 Other nonspecific abnormal finding of lung field: Secondary | ICD-10-CM | POA: Diagnosis not present

## 2023-03-12 DIAGNOSIS — M5412 Radiculopathy, cervical region: Secondary | ICD-10-CM | POA: Diagnosis not present

## 2023-03-14 DIAGNOSIS — R918 Other nonspecific abnormal finding of lung field: Secondary | ICD-10-CM | POA: Diagnosis not present

## 2023-03-14 DIAGNOSIS — I3139 Other pericardial effusion (noninflammatory): Secondary | ICD-10-CM | POA: Diagnosis not present

## 2023-03-27 DIAGNOSIS — M542 Cervicalgia: Secondary | ICD-10-CM | POA: Diagnosis not present

## 2023-03-27 DIAGNOSIS — Z299 Encounter for prophylactic measures, unspecified: Secondary | ICD-10-CM | POA: Diagnosis not present

## 2023-03-27 DIAGNOSIS — R52 Pain, unspecified: Secondary | ICD-10-CM | POA: Diagnosis not present

## 2023-03-27 DIAGNOSIS — R739 Hyperglycemia, unspecified: Secondary | ICD-10-CM | POA: Diagnosis not present

## 2023-03-27 DIAGNOSIS — E1165 Type 2 diabetes mellitus with hyperglycemia: Secondary | ICD-10-CM | POA: Diagnosis not present

## 2023-03-27 DIAGNOSIS — G4700 Insomnia, unspecified: Secondary | ICD-10-CM | POA: Diagnosis not present

## 2023-03-27 DIAGNOSIS — R5383 Other fatigue: Secondary | ICD-10-CM | POA: Diagnosis not present

## 2023-04-01 DIAGNOSIS — M47812 Spondylosis without myelopathy or radiculopathy, cervical region: Secondary | ICD-10-CM | POA: Diagnosis not present

## 2023-04-01 DIAGNOSIS — G4486 Cervicogenic headache: Secondary | ICD-10-CM | POA: Diagnosis not present

## 2023-04-16 ENCOUNTER — Other Ambulatory Visit: Payer: Self-pay | Admitting: Emergency Medicine

## 2023-04-16 DIAGNOSIS — F0280 Dementia in other diseases classified elsewhere without behavioral disturbance: Secondary | ICD-10-CM

## 2023-04-24 NOTE — Discharge Instructions (Signed)

## 2023-04-25 ENCOUNTER — Ambulatory Visit
Admission: RE | Admit: 2023-04-25 | Discharge: 2023-04-25 | Disposition: A | Discharge status: Home / Self Care | Source: Ambulatory Visit | Attending: Emergency Medicine | Admitting: Emergency Medicine

## 2023-04-25 DIAGNOSIS — F0280 Dementia in other diseases classified elsewhere without behavioral disturbance: Secondary | ICD-10-CM

## 2023-05-20 DIAGNOSIS — Z299 Encounter for prophylactic measures, unspecified: Secondary | ICD-10-CM | POA: Diagnosis not present

## 2023-05-20 DIAGNOSIS — E7800 Pure hypercholesterolemia, unspecified: Secondary | ICD-10-CM | POA: Diagnosis not present

## 2023-05-20 DIAGNOSIS — E1142 Type 2 diabetes mellitus with diabetic polyneuropathy: Secondary | ICD-10-CM | POA: Diagnosis not present

## 2023-05-20 DIAGNOSIS — E1169 Type 2 diabetes mellitus with other specified complication: Secondary | ICD-10-CM | POA: Diagnosis not present

## 2023-05-20 DIAGNOSIS — E1129 Type 2 diabetes mellitus with other diabetic kidney complication: Secondary | ICD-10-CM | POA: Diagnosis not present

## 2023-05-20 DIAGNOSIS — R809 Proteinuria, unspecified: Secondary | ICD-10-CM | POA: Diagnosis not present

## 2023-05-20 DIAGNOSIS — E1140 Type 2 diabetes mellitus with diabetic neuropathy, unspecified: Secondary | ICD-10-CM | POA: Diagnosis not present

## 2023-06-10 DIAGNOSIS — M542 Cervicalgia: Secondary | ICD-10-CM | POA: Diagnosis not present

## 2023-06-10 DIAGNOSIS — E7800 Pure hypercholesterolemia, unspecified: Secondary | ICD-10-CM | POA: Diagnosis not present

## 2023-06-10 DIAGNOSIS — E1169 Type 2 diabetes mellitus with other specified complication: Secondary | ICD-10-CM | POA: Diagnosis not present

## 2023-06-10 DIAGNOSIS — R52 Pain, unspecified: Secondary | ICD-10-CM | POA: Diagnosis not present

## 2023-06-10 DIAGNOSIS — Z299 Encounter for prophylactic measures, unspecified: Secondary | ICD-10-CM | POA: Diagnosis not present

## 2023-07-09 DIAGNOSIS — Z0000 Encounter for general adult medical examination without abnormal findings: Secondary | ICD-10-CM | POA: Diagnosis not present

## 2023-07-09 DIAGNOSIS — E1142 Type 2 diabetes mellitus with diabetic polyneuropathy: Secondary | ICD-10-CM | POA: Diagnosis not present

## 2023-07-09 DIAGNOSIS — Z1389 Encounter for screening for other disorder: Secondary | ICD-10-CM | POA: Diagnosis not present

## 2023-07-09 DIAGNOSIS — E7800 Pure hypercholesterolemia, unspecified: Secondary | ICD-10-CM | POA: Diagnosis not present

## 2023-07-09 DIAGNOSIS — Z299 Encounter for prophylactic measures, unspecified: Secondary | ICD-10-CM | POA: Diagnosis not present

## 2023-07-09 DIAGNOSIS — Z7189 Other specified counseling: Secondary | ICD-10-CM | POA: Diagnosis not present

## 2023-07-09 DIAGNOSIS — Z713 Dietary counseling and surveillance: Secondary | ICD-10-CM | POA: Diagnosis not present

## 2023-08-26 DIAGNOSIS — Z299 Encounter for prophylactic measures, unspecified: Secondary | ICD-10-CM | POA: Diagnosis not present

## 2023-08-26 DIAGNOSIS — E1165 Type 2 diabetes mellitus with hyperglycemia: Secondary | ICD-10-CM | POA: Diagnosis not present

## 2023-08-26 DIAGNOSIS — G4700 Insomnia, unspecified: Secondary | ICD-10-CM | POA: Diagnosis not present

## 2023-08-26 DIAGNOSIS — M4802 Spinal stenosis, cervical region: Secondary | ICD-10-CM | POA: Diagnosis not present

## 2023-10-09 DIAGNOSIS — Z961 Presence of intraocular lens: Secondary | ICD-10-CM | POA: Diagnosis not present

## 2023-10-09 DIAGNOSIS — H04123 Dry eye syndrome of bilateral lacrimal glands: Secondary | ICD-10-CM | POA: Diagnosis not present

## 2023-11-08 DIAGNOSIS — E1165 Type 2 diabetes mellitus with hyperglycemia: Secondary | ICD-10-CM | POA: Diagnosis not present

## 2023-11-08 DIAGNOSIS — Z299 Encounter for prophylactic measures, unspecified: Secondary | ICD-10-CM | POA: Diagnosis not present

## 2023-11-08 DIAGNOSIS — R52 Pain, unspecified: Secondary | ICD-10-CM | POA: Diagnosis not present

## 2023-11-08 DIAGNOSIS — R059 Cough, unspecified: Secondary | ICD-10-CM | POA: Diagnosis not present

## 2023-12-02 DIAGNOSIS — R52 Pain, unspecified: Secondary | ICD-10-CM | POA: Diagnosis not present

## 2023-12-02 DIAGNOSIS — R300 Dysuria: Secondary | ICD-10-CM | POA: Diagnosis not present

## 2023-12-02 DIAGNOSIS — E119 Type 2 diabetes mellitus without complications: Secondary | ICD-10-CM | POA: Diagnosis not present

## 2023-12-02 DIAGNOSIS — Z299 Encounter for prophylactic measures, unspecified: Secondary | ICD-10-CM | POA: Diagnosis not present

## 2023-12-02 DIAGNOSIS — N390 Urinary tract infection, site not specified: Secondary | ICD-10-CM | POA: Diagnosis not present

## 2023-12-02 DIAGNOSIS — Z23 Encounter for immunization: Secondary | ICD-10-CM | POA: Diagnosis not present

## 2023-12-16 NOTE — Progress Notes (Signed)
 Joshua Perez                                          MRN: [id number]   12/16/2023   The [facility] Quality Team Specialist reviewed this patient medical record for the purposes of chart review for care gap closure. The following were reviewed: chart review for care gap closure-kidney health evaluation for diabetes:eGFR  and uACR.    [facility]I Quality Team

## 2024-01-29 ENCOUNTER — Encounter (INDEPENDENT_AMBULATORY_CARE_PROVIDER_SITE_OTHER): Payer: Self-pay | Admitting: *Deleted

## 2024-01-30 ENCOUNTER — Other Ambulatory Visit (INDEPENDENT_AMBULATORY_CARE_PROVIDER_SITE_OTHER): Payer: Self-pay

## 2024-01-30 ENCOUNTER — Ambulatory Visit (INDEPENDENT_AMBULATORY_CARE_PROVIDER_SITE_OTHER): Admitting: Gastroenterology

## 2024-01-30 ENCOUNTER — Telehealth (INDEPENDENT_AMBULATORY_CARE_PROVIDER_SITE_OTHER): Payer: Self-pay

## 2024-01-30 VITALS — BP 116/67 | HR 80 | Temp 97.3°F | Ht 75.0 in | Wt 221.4 lb

## 2024-01-30 DIAGNOSIS — R10816 Epigastric abdominal tenderness: Secondary | ICD-10-CM

## 2024-01-30 DIAGNOSIS — K921 Melena: Secondary | ICD-10-CM | POA: Insufficient documentation

## 2024-01-30 DIAGNOSIS — R634 Abnormal weight loss: Secondary | ICD-10-CM | POA: Diagnosis not present

## 2024-01-30 DIAGNOSIS — R10815 Periumbilic abdominal tenderness: Secondary | ICD-10-CM | POA: Insufficient documentation

## 2024-01-30 MED ORDER — PANTOPRAZOLE SODIUM 40 MG PO TBEC: 40.0000 mg | DELAYED_RELEASE_TABLET | Freq: Every day | ORAL | 1 refills | Status: AC

## 2024-01-30 MED ORDER — PEG 3350-KCL-NA BICARB-NACL 420 G PO SOLR: 4000.0000 mL | Freq: Once | ORAL | 0 refills | Status: AC

## 2024-01-30 NOTE — H&P (View-Only) (Signed)
 Verlie Liotta Faizan Ivanka Kirshner , M.D. Gastroenterology & Hepatology [facility] [address] [place], [place] [address] Primary Care Physician: Rosan Jacquline NOVAK, NP [address] [place] [address]  Chief Complaint: Abdominal pain, unintentional weight loss and questionable melena   History of Present Illness: Joshua Perez is a 73 y.o. male with diabetes on insulin , hyperlipidemia, hypertension who presents for evaluation of Abdominal pain, unintentional weight loss and questionable melena  Patient reports for past few months he has abdominal pain associated with black-colored stools and unintentional weight loss.  Patient reports pain is located entire upper abdomen worsens with any sort of food and no relieving factor.  Pain is nonradiating and severe in intensity.  Patient is only eating bland diet at this time.  Denies any NSAID use but takes Pepto-Bismol.  Patient has noticed black tarry stools intermittently. The patient denies having any nausea, vomiting, fever, chills, hematemesis,  jaundice, pruritus .Patient reports he has lost 8 pounds in the past [redacted] week along  Last ZHI:wnwz Last Colonoscopy:2013- Dr Shaaron Rectosigmoid polyp -poor prep   FHx: neg for any gastrointestinal/liver disease, no malignancies Social: neg smoking, alcohol  or illicit drug use  Labs from 09/23/2023 hemoglobin 14.6 platelet 346 normal liver enzymes TSH 1.5 Past Medical History: Past Medical History:  Diagnosis Date   Abnormality of gait 09/15/2015   Anxiety    Arthritis    Asthma    Bipolar disorder (HCC) 1965   Onset in childhood; remote history of ECT   Chronic low back pain 12/10/2019   Coronary artery disease    Depression    Diabetes mellitus, type 2 (HCC) 1980   Recent hemoglobin A1c reportedly 5.3; controlled with single oral agent   Diabetic peripheral neuropathy (HCC) 09/15/2015   Erectile dysfunction    Headache    Heart murmur    as a  child   High cholesterol    History of kidney stones    Hypertension 1995   Hypothyroidism    Imbalance    Joint pain    Palpitations    Echocardiogram normal in 07/2007 except for mild LVH; Event recorder-first degree AV block, no symptomatic spells, no significant arrhythmias; stress nuclear negative in 1996   PONV (postoperative nausea and vomiting)    Sleep apnea    Syncope and collapse 09/15/2015    Past Surgical History: Past Surgical History:  Procedure Laterality Date   BIOPSY  09/21/2011   NOT DONE. Unable to remove from [facility] list   COLONOSCOPY  09/21/2011   RMR: Colonic polyps-treated/removed as described above/ Colonic diverticulosis. Lax sphincter time. Status post segmental biopsies(TUBULOVILLOUS ADENOMA/TUBULAR ADENOMA). Prep marginal. TCS 3 months recommended   COLONOSCOPY  01/18/2012   RMR: tubular adenoma. Surveillance due Dec 2016   COLONOSCOPY W/ POLYPECTOMY  2002   Dr. Gwendlyn Stark-->60mm sessile trv colon polyp, path unavailable   EYE SURGERY     bilateral cataracts   FRACTURE SURGERY  1967   right ankle repair   HERNIA REPAIR  1956   , right inguinal   NASAL SEPTUM SURGERY  1974   TONSILLECTOMY  1957   VASECTOMY  1988   ?  Subsequent reversal   VASECTOMY REVERSAL      Family History: Family History  Problem Relation Age of Onset   Diabetes Father    Heart attack Father    Alzheimer's disease Mother    Diabetes Paternal Grandfather    Heart disease Paternal Grandfather    Colon cancer  Neg Hx    Liver disease Neg Hx    Inflammatory bowel disease Neg Hx     Social History:Tobacco Use History[1] Social History   Substance and Sexual Activity  Alcohol  Use No   Social History   Substance and Sexual Activity  Drug Use No    Allergies: Allergies[2]  Medications: Current Outpatient Medications  Medication Sig Dispense Refill   ACCU-CHEK AVIVA PLUS test strip      Accu-Chek FastClix Lancets MISC      atorvastatin  (LIPITOR) 40 MG tablet  Take 40 mg by mouth daily.     ciprofloxacin (CIPRO) 500 MG tablet Take 500 mg by mouth 2 (two) times daily.     gabapentin  (NEURONTIN ) 800 MG tablet Take 1 tablet (800 mg total) by mouth 3 (three) times daily. (Patient taking differently: Take 600 mg by mouth 3 (three) times daily.) 270 tablet 1   insulin  lispro (HUMALOG) 100 UNIT/ML KwikPen SMARTSIG:6 Unit(s) SUB-Q Daily     Magnesium  400 MG TABS Take 800 mg by mouth 2 (two) times daily. (Patient taking differently: Take 250 mg by mouth QID.)     Naphazoline HCl (CLEAR EYES OP) Apply to eye.     pantoprazole  (PROTONIX ) 40 MG tablet Take 40 mg by mouth daily.     Sodium Chloride  (NASAL MIST IN) Inhale into the lungs.     sucralfate (CARAFATE) 1 g tablet Take 1 g by mouth 4 (four) times daily.     TOUJEO MAX SOLOSTAR 300 UNIT/ML Solostar Pen SMARTSIG:18 Unit(s) SUB-Q Daily (Patient taking differently: 60 Units.)     zolpidem  (AMBIEN ) 10 MG tablet Take 1 tablet (10 mg total) by mouth at bedtime as needed. for sleep 30 tablet 4   DULoxetine  (CYMBALTA ) 60 MG capsule Take 60 mg by mouth in the morning and at bedtime. (Patient not taking: Reported on 01/30/2024)     glipiZIDE  (GLUCOTROL ) 5 MG tablet Take 5 mg by mouth 2 (two) times daily before a meal. (Patient not taking: Reported on 01/30/2024)     levothyroxine  (SYNTHROID , LEVOTHROID) 75 MCG tablet Take 1 tablet (75 mcg total) by mouth daily. (Patient not taking: Reported on 01/30/2024) 30 tablet 12   metFORMIN  (GLUCOPHAGE ) 1000 MG tablet Take 1,000 mg by mouth 2 (two) times daily with a meal. (Patient not taking: Reported on 01/30/2024)     methocarbamol  (ROBAXIN ) 500 MG tablet Take 1 tablet (500 mg total) by mouth every 8 (eight) hours as needed for muscle spasms. (Patient not taking: Reported on 01/30/2024) 60 tablet 1   tiZANidine  (ZANAFLEX ) 4 MG tablet Take 1 tablet (4 mg total) by mouth in the morning, at noon, in the evening, and at bedtime. (Patient not taking: Reported on 01/30/2024) 120  tablet 4   No current facility-administered medications for this visit.    Review of Systems: GENERAL: negative for malaise, night sweats HEENT: No changes in hearing or vision, no nose bleeds or other nasal problems. NECK: Negative for lumps, goiter, pain and significant neck swelling RESPIRATORY: Negative for cough, wheezing CARDIOVASCULAR: Negative for chest pain, leg swelling, palpitations, orthopnea GI: SEE HPI MUSCULOSKELETAL: Negative for joint pain or swelling, back pain, and muscle pain. SKIN: Negative for lesions, rash HEMATOLOGY Negative for prolonged bleeding, bruising easily, and swollen nodes. ENDOCRINE: Negative for cold or heat intolerance, polyuria, polydipsia and goiter. NEURO: negative for tremor, gait imbalance, syncope and seizures. The remainder of the review of systems is noncontributory.   Physical Exam: BP 116/67   Pulse 80  Temp (!) 97.3 F (36.3 C)   Ht 6' 3 (1.905 m)   Wt 221 lb 6.4 oz (100.4 kg)   BMI 27.67 kg/m  GENERAL: The patient is AO x3, in no acute distress. HEENT: Head is normocephalic and atraumatic. EOMI are intact. Mouth is well hydrated and without lesions. NECK: Supple. No masses LUNGS: Clear to auscultation. No presence of rhonchi/wheezing/rales. Adequate chest expansion HEART: RRR, normal s1 and s2. ABDOMEN: Soft, diffuse abdominal tenderness on exam-,mild , no guarding, no peritoneal signs, and nondistended. BS +. No masses.  Imaging/Labs: as above     Latest Ref Rng & Units 01/18/2021    4:53 AM 01/13/2021   10:50 AM 09/04/2011    8:25 AM  CBC  WBC 4.0 - 10.5 K/uL 14.9  8.9  10.4   Hemoglobin 13.0 - 17.0 g/dL 86.7  84.0  84.7   Hematocrit 39.0 - 52.0 % 39.6  47.1  43.1   Platelets 150 - 400 K/uL 269  284  355    Lab Results  Component Value Date   IRON 75 11/06/2011   TIBC 351 11/06/2011    I personally reviewed and interpreted the available labs, imaging and endoscopic files.  Ct abdomen pelvis WITHOUT  contrast  IMPRESSION:   --No acute abnormality in the abdomen or pelvis to explain patient's symptoms.   --Chronic/incidental findings as detailed in the body the report, including cholelithiasis and punctate nonobstructing left upper pole calculus.   Mildly prominent 1.0 cm periportal/porta hepatis lymph node (2:50), previously 1.2 cm, likely reactive.   Impression and Plan: Joshua Perez is a 73 y.o. male with diabetes on insulin , hyperlipidemia, hypertension who presents for evaluation of Abdominal pain, unintentional weight loss and questionable melena  # Questionable melena # Unintentional weight loss # Diffuse abdominal tenderness  This is a very pleasant patient with advance age who is having unintentional weight loss, although no NSAID exposure but has diffuse abdominal tenderness more on epigastric area associated with black tarry stools concerning for melena.Pain increases with food intake  This could be peptic ulcer disease but need to rule out malignancy  Also given unintentional weight loss and last colonoscopy in 2013 which was rather a poor prep he is overdue his colonoscopy as well  At this time there is strong indication for bidirectional endoscopy to evaluate for any peptic ulcer disease, any lesion explaining melena such as polyps ulcers and exclude malignancy  For now continue with Protonix  40 mg 30 minutes before breakfast  If above workup is negative and patient continues to have pain may repeat CT abdomen pelvis with contrast as last study was without contrast and did had some periportal lymph node  Also postprandial pain can be due to cholelithiasis which remains a differential    I thoroughly discussed with the patient the procedure, including the risks involved. Patient understands what the procedure involves including the benefits and any risks. Patient understands alternatives to the proposed procedure. Risks including (but not limited to) bleeding,  tearing of the lining (perforation), rupture of adjacent organs, problems with heart and lung function, infection, and medication reactions. A small percentage of complications may require surgery, hospitalization, repeat endoscopic procedure, and/or transfusion.  Patient understood and agreed.   All questions were answered.      Joshua Traynham Faizan Catalea Labrecque, MD Gastroenterology and Hepatology [facility] Gastroenterology   This chart has been completed using [facility] Dictation software, and while attempts have been made to ensure accuracy , certain words  and phrases may not be transcribed as intended      [1]  Social History Tobacco Use  Smoking Status Never  Smokeless Tobacco Never  [2]  Allergies Allergen Reactions   Bee Venom Anaphylaxis   Penicillins Shortness Of Breath, Swelling and Rash   Trazodone 

## 2024-01-30 NOTE — Telephone Encounter (Signed)
 Spoke with patient in the office, scheduled TCS/EGD for 02/18/2024 at 9:45am. Rx sent to pharmacy. Instructions given to patient at the office.

## 2024-01-30 NOTE — Patient Instructions (Signed)
 It was very nice to meet you today, as dicussed with will plan for the following :  1) upper endoscopy and colonoscopy  2)I recommend taking Protonix  40mg  , 30 min before breakfast   3)Avoid  using high dose aspirin including Goody/BC powders, NSAIDs such as Aleve, ibuprofen, naproxen, Motrin, Voltaren or Advil (even the topical ones)

## 2024-01-30 NOTE — Telephone Encounter (Signed)
 PA on [facility] for TCS/EGD: Notification or Prior Authorization is not required for the requested services You are not required to submit a notification/prior authorization based on the information provided.  Decision ID #: [id number]

## 2024-01-30 NOTE — Progress Notes (Signed)
 Joshua Perez , M.D. Gastroenterology & Hepatology [facility] [address] [place], [place] [address] Primary Care Physician: Rosan Jacquline NOVAK, NP [address] [place] [address]  Chief Complaint: Abdominal pain, unintentional weight loss and questionable melena   History of Present Illness: Joshua Perez is a 73 y.o. male with diabetes on insulin , hyperlipidemia, hypertension who presents for evaluation of Abdominal pain, unintentional weight loss and questionable melena  Patient reports for past few months he has abdominal pain associated with black-colored stools and unintentional weight loss.  Patient reports pain is located entire upper abdomen worsens with any sort of food and no relieving factor.  Pain is nonradiating and severe in intensity.  Patient is only eating bland diet at this time.  Denies any NSAID use but takes Pepto-Bismol.  Patient has noticed black tarry stools intermittently. The patient denies having any nausea, vomiting, fever, chills, hematemesis,  jaundice, pruritus .Patient reports he has lost 8 pounds in the past [redacted] week along  Last ZHI:wnwz Last Colonoscopy:2013- Dr Shaaron Rectosigmoid polyp -poor prep   FHx: neg for any gastrointestinal/liver disease, no malignancies Social: neg smoking, alcohol  or illicit drug use  Labs from 09/23/2023 hemoglobin 14.6 platelet 346 normal liver enzymes TSH 1.5 Past Medical History: Past Medical History:  Diagnosis Date   Abnormality of gait 09/15/2015   Anxiety    Arthritis    Asthma    Bipolar disorder (HCC) 1965   Onset in childhood; remote history of ECT   Chronic low back pain 12/10/2019   Coronary artery disease    Depression    Diabetes mellitus, type 2 (HCC) 1980   Recent hemoglobin A1c reportedly 5.3; controlled with single oral agent   Diabetic peripheral neuropathy (HCC) 09/15/2015   Erectile dysfunction    Headache    Heart murmur    as a  child   High cholesterol    History of kidney stones    Hypertension 1995   Hypothyroidism    Imbalance    Joint pain    Palpitations    Echocardiogram normal in 07/2007 except for mild LVH; Event recorder-first degree AV block, no symptomatic spells, no significant arrhythmias; stress nuclear negative in 1996   PONV (postoperative nausea and vomiting)    Sleep apnea    Syncope and collapse 09/15/2015    Past Surgical History: Past Surgical History:  Procedure Laterality Date   BIOPSY  09/21/2011   NOT DONE. Unable to remove from [facility] list   COLONOSCOPY  09/21/2011   RMR: Colonic polyps-treated/removed as described above/ Colonic diverticulosis. Lax sphincter time. Status post segmental biopsies(TUBULOVILLOUS ADENOMA/TUBULAR ADENOMA). Prep marginal. TCS 3 months recommended   COLONOSCOPY  01/18/2012   RMR: tubular adenoma. Surveillance due Dec 2016   COLONOSCOPY W/ POLYPECTOMY  2002   Dr. Gwendlyn Stark-->60mm sessile trv colon polyp, path unavailable   EYE SURGERY     bilateral cataracts   FRACTURE SURGERY  1967   right ankle repair   HERNIA REPAIR  1956   , right inguinal   NASAL SEPTUM SURGERY  1974   TONSILLECTOMY  1957   VASECTOMY  1988   ?  Subsequent reversal   VASECTOMY REVERSAL      Family History: Family History  Problem Relation Age of Onset   Diabetes Father    Heart attack Father    Alzheimer's disease Mother    Diabetes Paternal Grandfather    Heart disease Paternal Grandfather    Colon cancer  Neg Hx    Liver disease Neg Hx    Inflammatory bowel disease Neg Hx     Social History:Tobacco Use History[1] Social History   Substance and Sexual Activity  Alcohol  Use No   Social History   Substance and Sexual Activity  Drug Use No    Allergies: Allergies[2]  Medications: Current Outpatient Medications  Medication Sig Dispense Refill   ACCU-CHEK AVIVA PLUS test strip      Accu-Chek FastClix Lancets MISC      atorvastatin  (LIPITOR) 40 MG tablet  Take 40 mg by mouth daily.     ciprofloxacin (CIPRO) 500 MG tablet Take 500 mg by mouth 2 (two) times daily.     gabapentin  (NEURONTIN ) 800 MG tablet Take 1 tablet (800 mg total) by mouth 3 (three) times daily. (Patient taking differently: Take 600 mg by mouth 3 (three) times daily.) 270 tablet 1   insulin  lispro (HUMALOG) 100 UNIT/ML KwikPen SMARTSIG:6 Unit(s) SUB-Q Daily     Magnesium  400 MG TABS Take 800 mg by mouth 2 (two) times daily. (Patient taking differently: Take 250 mg by mouth QID.)     Naphazoline HCl (CLEAR EYES OP) Apply to eye.     pantoprazole  (PROTONIX ) 40 MG tablet Take 40 mg by mouth daily.     Sodium Chloride  (NASAL MIST IN) Inhale into the lungs.     sucralfate (CARAFATE) 1 g tablet Take 1 g by mouth 4 (four) times daily.     TOUJEO MAX SOLOSTAR 300 UNIT/ML Solostar Pen SMARTSIG:18 Unit(s) SUB-Q Daily (Patient taking differently: 60 Units.)     zolpidem  (AMBIEN ) 10 MG tablet Take 1 tablet (10 mg total) by mouth at bedtime as needed. for sleep 30 tablet 4   DULoxetine  (CYMBALTA ) 60 MG capsule Take 60 mg by mouth in the morning and at bedtime. (Patient not taking: Reported on 01/30/2024)     glipiZIDE  (GLUCOTROL ) 5 MG tablet Take 5 mg by mouth 2 (two) times daily before a meal. (Patient not taking: Reported on 01/30/2024)     levothyroxine  (SYNTHROID , LEVOTHROID) 75 MCG tablet Take 1 tablet (75 mcg total) by mouth daily. (Patient not taking: Reported on 01/30/2024) 30 tablet 12   metFORMIN  (GLUCOPHAGE ) 1000 MG tablet Take 1,000 mg by mouth 2 (two) times daily with a meal. (Patient not taking: Reported on 01/30/2024)     methocarbamol  (ROBAXIN ) 500 MG tablet Take 1 tablet (500 mg total) by mouth every 8 (eight) hours as needed for muscle spasms. (Patient not taking: Reported on 01/30/2024) 60 tablet 1   tiZANidine  (ZANAFLEX ) 4 MG tablet Take 1 tablet (4 mg total) by mouth in the morning, at noon, in the evening, and at bedtime. (Patient not taking: Reported on 01/30/2024) 120  tablet 4   No current facility-administered medications for this visit.    Review of Systems: GENERAL: negative for malaise, night sweats HEENT: No changes in hearing or vision, no nose bleeds or other nasal problems. NECK: Negative for lumps, goiter, pain and significant neck swelling RESPIRATORY: Negative for cough, wheezing CARDIOVASCULAR: Negative for chest pain, leg swelling, palpitations, orthopnea GI: SEE HPI MUSCULOSKELETAL: Negative for joint pain or swelling, back pain, and muscle pain. SKIN: Negative for lesions, rash HEMATOLOGY Negative for prolonged bleeding, bruising easily, and swollen nodes. ENDOCRINE: Negative for cold or heat intolerance, polyuria, polydipsia and goiter. NEURO: negative for tremor, gait imbalance, syncope and seizures. The remainder of the review of systems is noncontributory.   Physical Exam: BP 116/67   Pulse 80  Temp (!) 97.3 F (36.3 C)   Ht 6' 3 (1.905 m)   Wt 221 lb 6.4 oz (100.4 kg)   BMI 27.67 kg/m  GENERAL: The patient is AO x3, in no acute distress. HEENT: Head is normocephalic and atraumatic. EOMI are intact. Mouth is well hydrated and without lesions. NECK: Supple. No masses LUNGS: Clear to auscultation. No presence of rhonchi/wheezing/rales. Adequate chest expansion HEART: RRR, normal s1 and s2. ABDOMEN: Soft, diffuse abdominal tenderness on exam-,mild , no guarding, no peritoneal signs, and nondistended. BS +. No masses.  Imaging/Labs: as above     Latest Ref Rng & Units 01/18/2021    4:53 AM 01/13/2021   10:50 AM 09/04/2011    8:25 AM  CBC  WBC 4.0 - 10.5 K/uL 14.9  8.9  10.4   Hemoglobin 13.0 - 17.0 g/dL 86.7  84.0  84.7   Hematocrit 39.0 - 52.0 % 39.6  47.1  43.1   Platelets 150 - 400 K/uL 269  284  355    Lab Results  Component Value Date   IRON 75 11/06/2011   TIBC 351 11/06/2011    I personally reviewed and interpreted the available labs, imaging and endoscopic files.  Ct abdomen pelvis WITHOUT  contrast  IMPRESSION:   --No acute abnormality in the abdomen or pelvis to explain patient's symptoms.   --Chronic/incidental findings as detailed in the body the report, including cholelithiasis and punctate nonobstructing left upper pole calculus.   Mildly prominent 1.0 cm periportal/porta hepatis lymph node (2:50), previously 1.2 cm, likely reactive.   Impression and Plan: Joshua Perez is a 73 y.o. male with diabetes on insulin , hyperlipidemia, hypertension who presents for evaluation of Abdominal pain, unintentional weight loss and questionable melena  # Questionable melena # Unintentional weight loss # Diffuse abdominal tenderness  This is a very pleasant patient with advance age who is having unintentional weight loss, although no NSAID exposure but has diffuse abdominal tenderness more on epigastric area associated with black tarry stools concerning for melena.Pain increases with food intake  This could be peptic ulcer disease but need to rule out malignancy  Also given unintentional weight loss and last colonoscopy in 2013 which was rather a poor prep he is overdue his colonoscopy as well  At this time there is strong indication for bidirectional endoscopy to evaluate for any peptic ulcer disease, any lesion explaining melena such as polyps ulcers and exclude malignancy  For now continue with Protonix  40 mg 30 minutes before breakfast  If above workup is negative and patient continues to have pain may repeat CT abdomen pelvis with contrast as last study was without contrast and did had some periportal lymph node  Also postprandial pain can be due to cholelithiasis which remains a differential    I thoroughly discussed with the patient the procedure, including the risks involved. Patient understands what the procedure involves including the benefits and any risks. Patient understands alternatives to the proposed procedure. Risks including (but not limited to) bleeding,  tearing of the lining (perforation), rupture of adjacent organs, problems with heart and lung function, infection, and medication reactions. A small percentage of complications may require surgery, hospitalization, repeat endoscopic procedure, and/or transfusion.  Patient understood and agreed.   All questions were answered.      Joshiah Traynham Faizan Catalea Labrecque, MD Gastroenterology and Hepatology [facility]   This chart has been completed using [facility] Dictation software, and while attempts have been made to ensure accuracy , certain words  and phrases may not be transcribed as intended      [1]  Social History Tobacco Use  Smoking Status Never  Smokeless Tobacco Never  [2]  Allergies Allergen Reactions   Bee Venom Anaphylaxis   Penicillins Shortness Of Breath, Swelling and Rash   Trazodone 

## 2024-02-18 ENCOUNTER — Ambulatory Visit (HOSPITAL_COMMUNITY)
Admission: RE | Admit: 2024-02-18 | Discharge: 2024-02-18 | Disposition: A | Discharge status: Home / Self Care | Attending: Gastroenterology | Admitting: Gastroenterology

## 2024-02-18 ENCOUNTER — Encounter (HOSPITAL_COMMUNITY): Payer: Self-pay | Admitting: Gastroenterology

## 2024-02-18 ENCOUNTER — Ambulatory Visit (HOSPITAL_COMMUNITY): Admitting: Anesthesiology

## 2024-02-18 ENCOUNTER — Encounter (HOSPITAL_COMMUNITY)
Admission: RE | Disposition: A | Discharge status: Home / Self Care | Payer: Self-pay | Source: Home / Self Care | Attending: Gastroenterology

## 2024-02-18 ENCOUNTER — Other Ambulatory Visit: Payer: Self-pay

## 2024-02-18 ENCOUNTER — Encounter (INDEPENDENT_AMBULATORY_CARE_PROVIDER_SITE_OTHER): Payer: Self-pay | Admitting: *Deleted

## 2024-02-18 DIAGNOSIS — E119 Type 2 diabetes mellitus without complications: Secondary | ICD-10-CM | POA: Insufficient documentation

## 2024-02-18 DIAGNOSIS — I2510 Atherosclerotic heart disease of native coronary artery without angina pectoris: Secondary | ICD-10-CM | POA: Insufficient documentation

## 2024-02-18 DIAGNOSIS — F419 Anxiety disorder, unspecified: Secondary | ICD-10-CM | POA: Diagnosis not present

## 2024-02-18 DIAGNOSIS — G4730 Sleep apnea, unspecified: Secondary | ICD-10-CM | POA: Diagnosis not present

## 2024-02-18 DIAGNOSIS — K3189 Other diseases of stomach and duodenum: Secondary | ICD-10-CM | POA: Insufficient documentation

## 2024-02-18 DIAGNOSIS — D125 Benign neoplasm of sigmoid colon: Secondary | ICD-10-CM | POA: Diagnosis not present

## 2024-02-18 DIAGNOSIS — F319 Bipolar disorder, unspecified: Secondary | ICD-10-CM | POA: Diagnosis not present

## 2024-02-18 DIAGNOSIS — D123 Benign neoplasm of transverse colon: Secondary | ICD-10-CM | POA: Diagnosis not present

## 2024-02-18 DIAGNOSIS — Z7984 Long term (current) use of oral hypoglycemic drugs: Secondary | ICD-10-CM | POA: Insufficient documentation

## 2024-02-18 DIAGNOSIS — I10 Essential (primary) hypertension: Secondary | ICD-10-CM | POA: Diagnosis not present

## 2024-02-18 DIAGNOSIS — K921 Melena: Secondary | ICD-10-CM | POA: Diagnosis present

## 2024-02-18 DIAGNOSIS — K648 Other hemorrhoids: Secondary | ICD-10-CM | POA: Diagnosis not present

## 2024-02-18 DIAGNOSIS — Z794 Long term (current) use of insulin: Secondary | ICD-10-CM | POA: Insufficient documentation

## 2024-02-18 DIAGNOSIS — E039 Hypothyroidism, unspecified: Secondary | ICD-10-CM | POA: Insufficient documentation

## 2024-02-18 DIAGNOSIS — R634 Abnormal weight loss: Secondary | ICD-10-CM | POA: Diagnosis not present

## 2024-02-18 DIAGNOSIS — R1084 Generalized abdominal pain: Secondary | ICD-10-CM | POA: Insufficient documentation

## 2024-02-18 DIAGNOSIS — K2970 Gastritis, unspecified, without bleeding: Secondary | ICD-10-CM

## 2024-02-18 DIAGNOSIS — Z79899 Other long term (current) drug therapy: Secondary | ICD-10-CM | POA: Diagnosis not present

## 2024-02-18 DIAGNOSIS — E785 Hyperlipidemia, unspecified: Secondary | ICD-10-CM | POA: Insufficient documentation

## 2024-02-18 DIAGNOSIS — Z6827 Body mass index (BMI) 27.0-27.9, adult: Secondary | ICD-10-CM | POA: Diagnosis not present

## 2024-02-18 HISTORY — PX: COLONOSCOPY: SHX5424

## 2024-02-18 HISTORY — PX: ESOPHAGOGASTRODUODENOSCOPY: SHX5428

## 2024-02-18 LAB — HM COLONOSCOPY

## 2024-02-18 LAB — GLUCOSE, CAPILLARY
Glucose-Capillary: 111 mg/dL — ABNORMAL HIGH (ref 70–99)
Glucose-Capillary: 98 mg/dL (ref 70–99)

## 2024-02-18 MED ORDER — LACTATED RINGERS IV SOLN: INTRAVENOUS | Status: DC

## 2024-02-18 MED ORDER — PROPOFOL 10 MG/ML IV BOLUS
INTRAVENOUS | Status: DC | PRN
  Administered 2024-02-18: 200 ug/kg/min via INTRAVENOUS

## 2024-02-18 MED ORDER — LIDOCAINE 2% (20 MG/ML) 5 ML SYRINGE
INTRAMUSCULAR | Status: DC | PRN
  Administered 2024-02-18: 100 mg via INTRAVENOUS

## 2024-02-18 NOTE — Transfer of Care (Signed)
 Immediate Anesthesia Transfer of Care Note  Patient: Joshua Perez  Procedure(s) Performed: COLONOSCOPY EGD (ESOPHAGOGASTRODUODENOSCOPY)  Patient Location: PACU  Anesthesia Type:MAC  Level of Consciousness: awake  Airway & Oxygen Therapy: Patient Spontanous Breathing and Patient connected to nasal cannula oxygen  Post-op Assessment: Report given to RN  Post vital signs: Reviewed  Last Vitals:  Vitals Value Taken Time  BP    Temp    Pulse    Resp    SpO2      Last Pain:  Vitals:   02/18/24 1008  TempSrc:   PainSc: 0-No pain      Patients Stated Pain Goal: 2 (02/18/24 0845)  Complications: There were no known notable events for this encounter.

## 2024-02-18 NOTE — Op Note (Signed)
 [facility] Patient Name: Joshua Perez Procedure Date: 02/18/2024 9:54 AM MRN: [id number] Date of Birth: September 27, 1950 Attending MD: Deatrice Dine , MD, [id number] CSN: [id number] Age: 74 Admit Type: Outpatient Procedure:                Colonoscopy Indications:              Melena, Weight loss Providers:                Deatrice Dine, MD, Jon LABOR. Gerome RN, RN,                            Dorcas Lenis, Technician Referring MD:              Medicines:                Monitored Anesthesia Care Complications:            No immediate complications. Estimated Blood Loss:     Estimated blood loss was minimal. Procedure:                Pre-Anesthesia Assessment:                           - Prior to the procedure, a History and Physical                            was performed, and patient medications and                            allergies were reviewed. The patient's tolerance of                            previous anesthesia was also reviewed. The risks                            and benefits of the procedure and the sedation                            options and risks were discussed with the patient.                            All questions were answered, and informed consent                            was obtained. Prior Anticoagulants: The patient has                            taken no anticoagulant or antiplatelet agents. ASA                            Grade Assessment: II - A patient with mild systemic                            disease. After reviewing the risks and benefits,  the patient was deemed in satisfactory condition to                            undergo the procedure.                           After obtaining informed consent, the colonoscope                            was passed under direct vision. Throughout the                            procedure, the patient's blood pressure, pulse, and                            oxygen saturations  were monitored continuously. The                            [id number]-[id number] ([id number]) Colon was introduced through                            the anus and advanced to the the terminal ileum.                            The colonoscopy was performed without difficulty.                            The patient tolerated the procedure well. The                            quality of the bowel preparation was evaluated                            using the BBPS [facility] Bowel Preparation Scale)                            with scores of: Right Colon = 2 (minor amount of                            residual staining, small fragments of stool and/or                            opaque liquid, but mucosa seen well), Transverse                            Colon = 2 (minor amount of residual staining, small                            fragments of stool and/or opaque liquid, but mucosa                            seen well) and Left Colon = 2 (minor amount of  residual staining, small fragments of stool and/or                            opaque liquid, but mucosa seen well). The total                            BBPS score equals 6. The terminal ileum, ileocecal                            valve, appendiceal orifice, and rectum were                            photographed. Scope In: 10:21:58 AM Scope Out: 10:46:38 AM Scope Withdrawal Time: 0 hours 22 minutes 6 seconds  Total Procedure Duration: 0 hours 24 minutes 40 seconds  Findings:      The perianal and digital rectal examinations were normal.      An 8 mm polyp was found in the transverse colon. The polyp was sessile.       The polyp was removed with a cold snare. Resection and retrieval were       complete.      Three sessile polyps were found in the sigmoid colon. The polyps were 3       to 8 mm in size. These polyps were removed with a cold snare. Resection       and retrieval were complete.      Non-bleeding internal hemorrhoids were  found during retroflexion. The       hemorrhoids were small. Impression:               - One 8 mm polyp in the transverse colon, removed                            with a cold snare. Resected and retrieved.                           - Three 3 to 8 mm polyps in the sigmoid colon,                            removed with a cold snare. Resected and retrieved.                           - Non-bleeding internal hemorrhoids.                           -Prominent Appendix Moderate Sedation:      Per Anesthesia Care Recommendation:           - Patient has a contact number available for                            emergencies. The signs and symptoms of potential                            delayed complications were discussed with the  patient. Return to normal activities tomorrow.                            Written discharge instructions were provided to the                            patient.                           - Resume previous diet.                           - Continue present medications.                           - Await pathology results.                           - Repeat colonoscopy in 3-5 years for surveillance                            based on pathology results.                           - Return to GI clinic as previously scheduled.                           -Obtain CT abdomen and pelvis with IV contrast for                            evaluation of unintentional weight loss and                            prominent appendix seen Procedure Code(s):        --- Professional ---                           [phone], Colonoscopy, flexible; with removal of                            tumor(s), polyp(s), or other lesion(s) by snare                            technique Diagnosis Code(s):        --- Professional ---                           D12.3, Benign neoplasm of transverse colon (hepatic                            flexure or splenic flexure)                            D12.5, Benign neoplasm of sigmoid colon                           K64.8, Other hemorrhoids  K92.1, Melena (includes Hematochezia)                           R63.4, Abnormal weight loss CPT copyright 2022 American Medical Association. All rights reserved. The codes documented in this report are preliminary and upon coder review may  be revised to meet current compliance requirements. Deatrice Dine, MD Deatrice Dine, MD 02/18/2024 11:00:09 AM This report has been signed electronically. Number of Addenda: 0

## 2024-02-18 NOTE — Anesthesia Preprocedure Evaluation (Signed)
"                                    Anesthesia Evaluation  Patient identified by MRN, date of birth, ID band Patient awake    Reviewed: Allergy & Precautions, H&P , NPO status , Patient's Chart, lab work & pertinent test results, reviewed documented beta blocker date and time   History of Anesthesia Complications (+) PONV and history of anesthetic complications  Airway Mallampati: II  TM Distance: >3 FB Neck ROM: full    Dental no notable dental hx.    Pulmonary neg pulmonary ROS, asthma , sleep apnea    Pulmonary exam normal breath sounds clear to auscultation       Cardiovascular Exercise Tolerance: Good hypertension, + CAD  negative cardio ROS + Valvular Problems/Murmurs  Rhythm:regular Rate:Normal     Neuro/Psych  Headaches PSYCHIATRIC DISORDERS Anxiety Depression Bipolar Disorder    Neuromuscular disease negative neurological ROS  negative psych ROS   GI/Hepatic negative GI ROS, Neg liver ROS,,,  Endo/Other  negative endocrine ROSdiabetesHypothyroidism    Renal/GU negative Renal ROS  negative genitourinary   Musculoskeletal   Abdominal   Peds  Hematology negative hematology ROS (+)   Anesthesia Other Findings   Reproductive/Obstetrics negative OB ROS                              Anesthesia Physical Anesthesia Plan  ASA: 3  Anesthesia Plan: MAC   Post-op Pain Management:    Induction:   PONV Risk Score and Plan: Propofol  infusion  Airway Management Planned:   Additional Equipment:   Intra-op Plan:   Post-operative Plan:   Informed Consent: I have reviewed the patients History and Physical, chart, labs and discussed the procedure including the risks, benefits and alternatives for the proposed anesthesia with the patient or authorized representative who has indicated his/her understanding and acceptance.     Dental Advisory Given  Plan Discussed with: CRNA  Anesthesia Plan Comments:          Anesthesia Quick Evaluation  "

## 2024-02-18 NOTE — Interval H&P Note (Signed)
 History and Physical Interval Note:  02/18/2024 9:10 AM  Joshua Perez  has presented today for surgery, with the diagnosis of hematochezia, melena.  The various methods of treatment have been discussed with the patient and family. After consideration of risks, benefits and other options for treatment, the patient has consented to  Procedures with comments: COLONOSCOPY (N/A) - 9:45am, ASA 1-2 EGD (ESOPHAGOGASTRODUODENOSCOPY) (N/A) as a surgical intervention.  The patient's history has been reviewed, patient examined, no change in status, stable for surgery.  I have reviewed the patient's chart and labs.  Questions were answered to the patient's satisfaction.     Deatrice FALCON Zayne Draheim

## 2024-02-18 NOTE — Discharge Instructions (Signed)
" °  Discharge instructions Please read the instructions outlined below and refer to this sheet in the next few weeks. These discharge instructions provide you with general information on caring for yourself after you leave the hospital. Your doctor may also give you specific instructions. While your treatment has been planned according to the most current medical practices available, unavoidable complications occasionally occur. If you have any problems or questions after discharge, please call your doctor. ACTIVITY You may resume your regular activity but move at a slower pace for the next 24 hours.  Take frequent rest periods for the next 24 hours.  Walking will help expel (get rid of) the air and reduce the bloated feeling in your abdomen.  No driving for 24 hours (because of the anesthesia (medicine) used during the test).  You may shower.  Do not sign any important legal documents or operate any machinery for 24 hours (because of the anesthesia used during the test).  NUTRITION Drink plenty of fluids.  You may resume your normal diet.  Begin with a light meal and progress to your normal diet.  Avoid alcoholic beverages for 24 hours or as instructed by your caregiver.  MEDICATIONS You may resume your normal medications unless your caregiver tells you otherwise.  WHAT YOU CAN EXPECT TODAY You may experience abdominal discomfort such as a feeling of fullness or gas pains.  FOLLOW-UP Your doctor will discuss the results of your test with you.  SEEK IMMEDIATE MEDICAL ATTENTION IF ANY OF THE FOLLOWING OCCUR: Excessive nausea (feeling sick to your stomach) and/or vomiting.  Severe abdominal pain and distention (swelling).  Trouble swallowing.  Temperature over 101 F (37.8 C).  Rectal bleeding or vomiting of blood.     Obtain CT abdomen and pelvis with IV contrast for evaluation of unintentional weight loss and prominent appendix seen   I hope you have a great rest of your week!    Joshua Perez Faizan Vernel Langenderfer , M.D.. Gastroenterology and Hepatology [facility]    "

## 2024-02-18 NOTE — Op Note (Signed)
 [facility] Patient Name: Joshua Perez Procedure Date: 02/18/2024 10:00 AM MRN: [id number] Date of Birth: 1950-04-25 Attending MD: Deatrice Dine , MD, [id number] CSN: [id number] Age: 74 Admit Type: Outpatient Procedure:                Upper GI endoscopy Indications:              Melena, Weight loss Providers:                Deatrice Dine, MD, Jon LABOR. Gerome RN, RN,                            Dorcas Lenis, Technician Referring MD:              Medicines:                 Complications:            No immediate complications. Estimated Blood Loss:     Estimated blood loss was minimal. Procedure:                Pre-Anesthesia Assessment:                           - Prior to the procedure, a History and Physical                            was performed, and patient medications and                            allergies were reviewed. The patient's tolerance of                            previous anesthesia was also reviewed. The risks                            and benefits of the procedure and the sedation                            options and risks were discussed with the patient.                            All questions were answered, and informed consent                            was obtained. Prior Anticoagulants: The patient has                            taken no anticoagulant or antiplatelet agents. ASA                            Grade Assessment: II - A patient with mild systemic                            disease. After reviewing the risks and benefits,  the patient was deemed in satisfactory condition to                            undergo the procedure.                           After obtaining informed consent, the endoscope was                            passed under direct vision. Throughout the                            procedure, the patient's blood pressure, pulse, and                            oxygen saturations were monitored  continuously. The                            [id number] ([id number]) Upper was introduced through                            the mouth, and advanced to the second part of                            duodenum. The upper GI endoscopy was accomplished                            without difficulty. The patient tolerated the                            procedure well. Scope In: 10:11:51 AM Scope Out: 10:14:59 AM Total Procedure Duration: 0 hours 3 minutes 8 seconds  Findings:      The examined esophagus was normal.      Mildly erythematous mucosa without bleeding was found in the gastric       antrum. Biopsies were taken with a cold forceps for histology.      The duodenal bulb and second portion of the duodenum were normal. Impression:               - Normal esophagus.                           - Erythematous mucosa in the antrum. Biopsied.                           - Normal duodenal bulb and second portion of the                            duodenum. Moderate Sedation:      Per Anesthesia Care Recommendation:           - Patient has a contact number available for                            emergencies. The signs and symptoms of potential  delayed complications were discussed with the                            patient. Return to normal activities tomorrow.                            Written discharge instructions were provided to the                            patient.                           - Resume previous diet.                           - Continue present medications.                           - Await pathology results. Procedure Code(s):        --- Professional ---                           [phone], Esophagogastroduodenoscopy, flexible,                            transoral; with biopsy, single or multiple Diagnosis Code(s):        --- Professional ---                           K31.89, Other diseases of stomach and duodenum                           K92.1,  Melena (includes Hematochezia)                           R63.4, Abnormal weight loss CPT copyright 2022 [employer]. All rights reserved. The codes documented in this report are preliminary and upon coder review may  be revised to meet current compliance requirements. Deatrice Dine, MD Deatrice Dine, MD 02/18/2024 10:20:54 AM This report has been signed electronically. Number of Addenda: 0

## 2024-02-19 ENCOUNTER — Encounter (HOSPITAL_COMMUNITY): Payer: Self-pay | Admitting: Gastroenterology

## 2024-02-19 LAB — SURGICAL PATHOLOGY

## 2024-02-21 ENCOUNTER — Telehealth (INDEPENDENT_AMBULATORY_CARE_PROVIDER_SITE_OTHER): Payer: Self-pay | Admitting: Gastroenterology

## 2024-02-21 ENCOUNTER — Ambulatory Visit (INDEPENDENT_AMBULATORY_CARE_PROVIDER_SITE_OTHER): Payer: Self-pay | Admitting: Gastroenterology

## 2024-02-21 NOTE — Telephone Encounter (Signed)
 Pt had procedure on 02/18/2024 by Dr Cinderella and has questions about if he needed to have a CT done.I told him I would get with nurse and if he doesn't hear back today since we close at 1130 that we would reach out on Monday. Pt agreed. [phone]

## 2024-02-24 NOTE — Telephone Encounter (Signed)
 Yes with oral as well.

## 2024-02-24 NOTE — Anesthesia Postprocedure Evaluation (Signed)
"   Anesthesia Post Note  Patient: Jonette LITTIE Minerva  Procedure(s) Performed: COLONOSCOPY EGD (ESOPHAGOGASTRODUODENOSCOPY)  Patient location during evaluation: Phase II Anesthesia Type: MAC Level of consciousness: awake Pain management: pain level controlled Vital Signs Assessment: post-procedure vital signs reviewed and stable Respiratory status: spontaneous breathing and respiratory function stable Cardiovascular status: blood pressure returned to baseline and stable Postop Assessment: no headache and no apparent nausea or vomiting Anesthetic complications: no Comments: Late entry   There were no known notable events for this encounter.   Last Vitals:  Vitals:   02/18/24 1106 02/18/24 1111  BP: (!) 91/47 100/66  Pulse: 75 78  Resp: 14 15  Temp:    SpO2: 95% 96%    Last Pain:  Vitals:   02/18/24 1111  TempSrc:   PainSc: 0-No pain                 Yvonna PARAS Elizabth Palka      "

## 2024-02-24 NOTE — Telephone Encounter (Signed)
 unintentional weight loss and prominent appendix seen on colonoscopy  Dx: Weight loss, abnormal [R63.4 (ICD-10-CM)]   Will pt need oral and IV contrast? Please advise. Thank you!

## 2024-02-24 NOTE — Telephone Encounter (Signed)
 ATC patient to inform him that I do not have orders to schedule a CT at this time but pt did not answer and VM box is full.

## 2024-02-24 NOTE — Telephone Encounter (Signed)
 Per last office visit-If above workup is negative and patient continues to have pain may repeat CT abdomen pelvis with contrast as last study was without contrast and did had some periportal lymph node  Also on procedure report, it states CT as discussed. Order is in Epic.  Thank you!!

## 2024-02-24 NOTE — Telephone Encounter (Signed)
 Dr. Cinderella, does this patient need to have a CT? Please advise.

## 2024-02-25 NOTE — Telephone Encounter (Signed)
 With oral also per Therisa NOVAK

## 2024-02-25 NOTE — Telephone Encounter (Signed)
 Prior shara is not required for CT on [facility]

## 2024-02-25 NOTE — Telephone Encounter (Signed)
 Spoke with patient, scheduled CT for 03/12/2024 at 4:30pm and arriving at 2:15pm to drink oral contrast. Patient aware and verbalized understanding.

## 2024-02-27 NOTE — Progress Notes (Signed)
 5 yr TCS noted in recall Patient result letter mailed procedure note and pathology result faxed to PCP

## 2024-03-03 NOTE — Telephone Encounter (Signed)
 Patient left me a vm stating that he wanted his CT appt sooner. Spoke with patients wife Leeroy ([place]) and gave her the number to central scheduling to see if they have any sooner available appointments.

## 2024-03-05 ENCOUNTER — Telehealth (INDEPENDENT_AMBULATORY_CARE_PROVIDER_SITE_OTHER): Payer: Self-pay | Admitting: Gastroenterology

## 2024-03-05 NOTE — Telephone Encounter (Signed)
 Returned patients call, he was wanting to know the results of his colonoscopy, as far as polyps and biopsies.

## 2024-03-05 NOTE — Telephone Encounter (Signed)
 Please call patient at [phone]

## 2024-03-05 NOTE — Telephone Encounter (Signed)
 Pt contacted. Went over colonoscopy report with patient. Pt verbalized understanding. Pt wanted to know what may be causing his pain. Per colonoscopy report, prominent appendix. Ct scan ordered and scheduled for unintentional weight loss and prominent appendix. Pt very thankful for call. Advised pt if he needed anything else feel free to give us  a call. Pt states that been rather hard. I gave patient my direct line number and informed him I was Dr.Ahmeds nurse. Pt very thankful.

## 2024-03-12 ENCOUNTER — Ambulatory Visit (HOSPITAL_COMMUNITY)
Admission: RE | Admit: 2024-03-12 | Discharge: 2024-03-12 | Disposition: A | Discharge status: Home / Self Care | Source: Ambulatory Visit | Attending: Gastroenterology | Admitting: Gastroenterology

## 2024-03-12 DIAGNOSIS — R634 Abnormal weight loss: Secondary | ICD-10-CM | POA: Insufficient documentation

## 2024-03-12 LAB — POCT I-STAT CREATININE: Creatinine, Ser: 1.3 mg/dL — ABNORMAL HIGH (ref 0.61–1.24)

## 2024-03-12 MED ORDER — IOHEXOL 300 MG/ML  SOLN
100.0000 mL | Freq: Once | INTRAMUSCULAR | Status: AC | PRN
  Administered 2024-03-12: 100 mL via INTRAVENOUS
